# Patient Record
Sex: Female | Born: 1960 | Race: Black or African American | Hispanic: No | Marital: Single | Smoking: Never smoker
Health system: Southern US, Community
[De-identification: ages and names within clinical notes are randomized; demographics above are authoritative.]

## PROBLEM LIST (undated history)

## (undated) DIAGNOSIS — E785 Hyperlipidemia, unspecified: Secondary | ICD-10-CM

## (undated) DIAGNOSIS — E119 Type 2 diabetes mellitus without complications: Secondary | ICD-10-CM

## (undated) DIAGNOSIS — I10 Essential (primary) hypertension: Secondary | ICD-10-CM

## (undated) HISTORY — DX: Type 2 diabetes mellitus without complications: E11.9

## (undated) HISTORY — DX: Essential (primary) hypertension: I10

## (undated) HISTORY — DX: Hyperlipidemia, unspecified: E78.5

## (undated) HISTORY — PX: NO PAST SURGERIES: SHX2092

---

## 2016-07-21 ENCOUNTER — Ambulatory Visit: Payer: Self-pay | Admitting: Nurse Practitioner

## 2016-10-07 ENCOUNTER — Encounter: Payer: Self-pay | Admitting: Nurse Practitioner

## 2016-10-07 ENCOUNTER — Ambulatory Visit (INDEPENDENT_AMBULATORY_CARE_PROVIDER_SITE_OTHER): Payer: PRIVATE HEALTH INSURANCE | Admitting: Nurse Practitioner

## 2016-10-07 VITALS — BP 166/92 | HR 68 | Temp 98.5°F | Resp 14 | Ht 60.0 in | Wt 152.2 lb

## 2016-10-07 DIAGNOSIS — M25512 Pain in left shoulder: Secondary | ICD-10-CM

## 2016-10-07 DIAGNOSIS — F558 Abuse of other non-psychoactive substances: Secondary | ICD-10-CM | POA: Diagnosis not present

## 2016-10-07 DIAGNOSIS — Z7689 Persons encountering health services in other specified circumstances: Secondary | ICD-10-CM

## 2016-10-07 DIAGNOSIS — I10 Essential (primary) hypertension: Secondary | ICD-10-CM | POA: Diagnosis not present

## 2016-10-07 LAB — COMPREHENSIVE METABOLIC PANEL
ALT: 25 U/L (ref 6–29)
AST: 22 U/L (ref 10–35)
Albumin: 4.3 g/dL (ref 3.6–5.1)
Alkaline Phosphatase: 89 U/L (ref 33–130)
BUN: 14 mg/dL (ref 7–25)
CO2: 26 mmol/L (ref 20–31)
Calcium: 9.5 mg/dL (ref 8.6–10.4)
Chloride: 104 mmol/L (ref 98–110)
Creat: 0.63 mg/dL (ref 0.50–1.05)
Glucose, Bld: 95 mg/dL (ref 65–99)
Potassium: 3.7 mmol/L (ref 3.5–5.3)
Sodium: 140 mmol/L (ref 135–146)
Total Bilirubin: 0.2 mg/dL (ref 0.2–1.2)
Total Protein: 7.5 g/dL (ref 6.1–8.1)

## 2016-10-07 MED ORDER — HYDROCHLOROTHIAZIDE 12.5 MG PO TABS: 12.5000 mg | ORAL_TABLET | Freq: Every day | ORAL | 3 refills | Status: DC

## 2016-10-07 NOTE — Progress Notes (Signed)
Subjective:    Patient ID: Courtney Mccarty, female    DOB: Jan 04, 1961, 55 y.o.   MRN: [id number]  Courtney Mccarty is a 56 y.o. female presenting on 10/07/2016 for New Patient (Initial Visit) and Pain (in joints)    HPI  Establish Care New Provider Pt last seen by PCP 4-5 years ago.  Obtain records from Dr. Jeanne Ivan in [place], [place].   Shoulder Pain CNA work as Education officer, museum.  Experienced L shoulder injury.  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  Has recurrent left shoulder pain and new pain of R shoulder, hands. Pain is described as aching and sore.  It is not limiting her work or other physical activities.  Pt is taking acetaminophen Extra Strength 5-6 pills takes 2 x per day. (2500 mg per dose for total of 5,000 mg daily).  From earlier injury, pt had previously received referral for neurology from Dr. Jeanne Ivan in [place], [place].  She has never followed through w/ that workup for tingling in her hands.  Hypertension Elevated on BP check today:  No prior BP elevations or current symptoms.  Current medications: none  Pt denies headache, lightheadedness, dizziness, changes in vision, chest tightness/pressure, palpitations, leg swelling, sudden loss of speech or loss of consciousness.  Pt states she is eating low salt diet, but states she is using Lawry's seasoning salt instead of table salt to reduce her sodium, which does not have less sodium than table salt.  Pt notes history of Gout- no current flare.  History reviewed. No pertinent past medical history. History reviewed. No pertinent surgical history. Social History   Social History  . Marital status: Single    Spouse name: N/A  . Number of children: N/A  . Years of education: N/A   Occupational History  . Not on file.   Social History Main Topics  . Smoking status: Never Smoker  . Smokeless tobacco: Never Used  . Alcohol use 0.6 oz/week    1 Cans of beer per week  . Drug use: No  . Sexual activity:  Yes    Birth control/ protection: None   Other Topics Concern  . Not on file   Social History Narrative  . No narrative on file   Family History  Problem Relation Age of Onset  . Diabetes Mother   . Hypertension Mother   . Heart disease Mother   . Depression Mother   . Diabetes Father   . Hypertension Father   . Diabetes Maternal Grandmother   . Cancer Maternal Grandfather   . Diabetes Paternal Grandmother   . Cancer Paternal Grandfather    No current outpatient prescriptions on file prior to visit.   No current facility-administered medications on file prior to visit.     Review of Systems  Constitutional: Negative.   HENT: Negative.   Eyes: Negative.   Respiratory: Negative.   Cardiovascular: Negative.   Gastrointestinal: Negative.   Endocrine: Negative.   Genitourinary: Negative.   Musculoskeletal: Positive for arthralgias.  Skin: Negative.   Allergic/Immunologic: Negative.   Neurological: Negative.   Hematological: Negative.   Psychiatric/Behavioral: Negative.    Per HPI unless specifically indicated above      Objective:    BP (!) 166/92 (BP Location: Left Arm, Patient Position: Sitting, Cuff Size: Normal)   Pulse 68   Temp 98.5 F (36.9 C) (Oral)   Resp 14   Ht 5' (1.524 m)   Wt 152 lb 3.2 oz (69 kg)  LMP 04/05/2015 (Approximate)   SpO2 98%   BMI 29.72 kg/m    Wt Readings from Last 3 Encounters:  10/07/16 152 lb 3.2 oz (69 kg)    Physical Exam  Constitutional: She is oriented to person, place, and time. She appears well-developed and well-nourished. No distress.  HENT:  Head: Normocephalic and atraumatic.  Right Ear: External ear normal.  Left Ear: External ear normal.  Mouth/Throat: Oropharynx is clear and moist.  Eyes: Conjunctivae and EOM are normal. Pupils are equal, round, and reactive to light.  Neck: Normal range of motion. Neck supple. No JVD present. No tracheal deviation present. No thyromegaly present.  Cardiovascular: Normal  rate, regular rhythm, normal heart sounds and intact distal pulses.   Pulmonary/Chest: Effort normal and breath sounds normal. No respiratory distress.  Abdominal: Soft. Bowel sounds are normal. She exhibits no distension and no mass. There is no tenderness.  Musculoskeletal: She exhibits tenderness.  LeftShoulder Inspection: Normal appearance bilateral symmetrical Palpation: Non-tender to palpation over anterior, or posterior shoulder.  Tender to deep palpation of lateral shoulder anterior to A-P midline  ROM: Full intact active ROM forward flexion, abduction, internal / external rotation, symmetrical Special Testing: Rotator cuff testing negative for weakness with supraspinatus full can and empty can test, but empty can test positive for eliciting pain. Hawkin's AC impingement negative for pain Strength: Normal strength 5/5 flex/ext, ext rot / int rot, grip, rotator cuff str testing. Neurovascular: Distally intact pulses, sensation to light touch   Lymphadenopathy:    She has no cervical adenopathy.  Neurological: She is alert and oriented to person, place, and time.  Skin: Skin is warm and dry.  Psychiatric: She has a normal mood and affect. Her behavior is normal. Judgment and thought content normal.   Results for orders placed or performed in visit on 10/07/16  Comprehensive metabolic panel  Result Value Ref Range   Sodium 140 135 - 146 mmol/L   Potassium 3.7 3.5 - 5.3 mmol/L   Chloride 104 98 - 110 mmol/L   CO2 26 20 - 31 mmol/L   Glucose, Bld 95 65 - 99 mg/dL   BUN 14 7 - 25 mg/dL   Creat 0.63 0.50 - 1.05 mg/dL   Total Bilirubin 0.2 0.2 - 1.2 mg/dL   Alkaline Phosphatase 89 33 - 130 U/L   AST 22 10 - 35 U/L   ALT 25 6 - 29 U/L   Total Protein 7.5 6.1 - 8.1 g/dL   Albumin 4.3 3.6 - 5.1 g/dL   Calcium 9.5 8.6 - 10.4 mg/dL      Assessment & Plan:   Problem List Items Addressed This Visit      Cardiovascular and Mediastinum   Essential hypertension - Primary    Pt w/ new  dx hypertension on exam today.  Single elevated reading at check in of 166/92 w/ repeat manually of 172/92.  Pt w/o s/sx of hypertension.  Plan: 1. Discussed basic disease process and lifestyle modifications of physical activity and heart healthy diet.  Provided DASH info. 2. START HCTZ 12.5 mg once daily.  Pt w/ history of gout, but no flares in "several years."  Proceed w/ HCTZ r/t pt African American and first line agent per guidelines.  Low threshold for changing therapy. 3. Encouraged BP check 1-2 x weekly at home.  Goal less than 130/80.  Call clinic if > 140/90 after 2 weeks or less than 100/70. 4. Follow up w/ BP check w/ annual physical and in 4  weeks for review of HTN.      Relevant Medications   hydrochlorothiazide (HYDRODIURIL) 12.5 MG tablet   Other Relevant Orders   Comprehensive metabolic panel (Completed)     Other   Acetaminophen abuse    Pt using 5,000 mg daily or more.  Pt w/o s/sx of jaundice today.  Plan: 1. Check CMP eval liver function. 2. Reviewed max dosing of acetaminophen to 3,000 mg in 24 hr period. 3. Address shoulder pain w/ Aleeve.  Take two OTC tablets every 12 hours as needed. - Encouraged proper body mechanics. - Encouraged rest, ice/heat. - Consider orthopedics and / or PT for further treatment. 4. Follow up 4 weeks.      Relevant Orders   Comprehensive metabolic panel (Completed)    Other Visit Diagnoses    Encounter to establish care     Pt w/ previous PCP in [place].  Last appointment 4-5 years ago w/ Dr. Jeanne Ivan in [place], [place].    Acute pain of left shoulder     Pt w/ acute injury of left shoulder.  Possible partial tear or strain of supraspinatus muscle not impacting AROM. Pain likely self-limited.  Plan: 1. Rest, apply ice/heat. 2. Treat with NSAIDs (acetaminophen and ibuprofen).  Discussed alternate dosing and max dosing. 3. May also apply a muscle rub with lidocaine after heat or ice. 4. Follow up 4 weeks or sooner if needed.  Consider orthopedics vs. PT if injury persists or worsens.       Meds ordered this encounter  Medications  . acetaminophen (TYLENOL) 500 MG tablet    Sig: Take 500 mg by mouth every 6 (six) hours as needed.  . hydrochlorothiazide (HYDRODIURIL) 12.5 MG tablet    Sig: Take 1 tablet (12.5 mg total) by mouth daily.    Dispense:  90 tablet    Refill:  3    Order Specific Question:   Supervising Provider    Answer:   Olin Hauser [[id number]]      Follow up plan: Return in about 4 weeks (around 11/04/2016) for and in 1-3 weeks for your annual physical.  Cassell Smiles, DNP, AGPCNP-BC Adult Gerontology Primary Care Nurse Practitioner [facility] 10/11/2016, 12:37 PM

## 2016-10-07 NOTE — Patient Instructions (Addendum)
Treacy, Thank you for coming in to clinic today.  1. For your aches and pains: Take Aleeve or naproxen sodium 220 mg pills.   - Take 2 tablets every 12 hours maximum (at the most) - NO MORE than 4 pills in 24 hours. - If you need more pain relief, take acetaminophen (red/white bottle) 2 tablets every 5 hours as needed. NO MORE than 6 pills in 24 hours.   2. For your blood pressure: - START taking hydrochlorothiazide 12.5 mg tablet once daily at the same time each day. - DASH eating plan: start eating more like this eating plan by choosing one item each week to start adding to your diet.  Pick one or two per week and continue these as you add new healthier foods.   3. You will be due for FASTING BLOOD WORK (no food or drink after midnight before, only water or coffee without cream/sugar on the morning of)  - Please go ahead and schedule a "Lab Only" visit in the morning at the clinic for lab draw in 2-3 days before your physical or the same morning of your Annual Physical  For Lab Results, once available within 2-3 days of blood draw, you can can log in to MyChart online to view your results and a brief explanation. Also, we can discuss results at next follow-up visit.  Please schedule a follow-up appointment with Cassell Smiles, AGNP to Return in about 4 weeks (around 11/04/2016) for and in 1-3 weeks for your annual physical.  If you have any other questions or concerns, please feel free to call the clinic or send a message through [facility]. You may also schedule an earlier appointment if necessary.  Cassell Smiles, DNP, AGNP-BC Adult Gerontology Nurse Practitioner [facility]    DASH Eating Plan DASH stands for "Dietary Approaches to Stop Hypertension." The DASH eating plan is a healthy eating plan that has been shown to reduce high blood pressure (hypertension). It may also reduce your risk for type 2 diabetes, heart disease, and stroke. The DASH eating plan may  also help with weight loss. What are tips for following this plan? General guidelines  Avoid eating more than 2,300 mg (milligrams) of salt (sodium) a day. If you have hypertension, you may need to reduce your sodium intake to 1,500 mg a day.  Limit alcohol intake to no more than 1 drink a day for nonpregnant women and 2 drinks a day for men. One drink equals 12 oz of beer, 5 oz of wine, or 1 oz of hard liquor.  Work with your health care provider to maintain a healthy body weight or to lose weight. Ask what an ideal weight is for you.  Get at least 30 minutes of exercise that causes your heart to beat faster (aerobic exercise) most days of the week. Activities may include walking, swimming, or biking.  Work with your health care provider or diet and nutrition specialist (dietitian) to adjust your eating plan to your individual calorie needs. Reading food labels  Check food labels for the amount of sodium per serving. Choose foods with less than 5 percent of the Daily Value of sodium. Generally, foods with less than 300 mg of sodium per serving fit into this eating plan.  -To find whole grains, look for the word "whole" as the first word in the ingredient list. Shopping  Buy products labeled as "low-sodium" or "no salt added."  Buy fresh foods. Avoid canned foods and premade or frozen  meals. Cooking  Avoid adding salt when cooking. Use salt-free seasonings or herbs instead of table salt or sea salt. Check with your health care provider or pharmacist before using salt substitutes.  Do not fry foods. Cook foods using healthy methods such as baking, boiling, grilling, and broiling instead.  Cook with heart-healthy oils, such as olive, canola, soybean, or sunflower oil. Meal planning -  Eat a balanced diet that includes: ? 5 or more servings of fruits and vegetables each day. At each meal, try to fill half of your plate with fruits and vegetables. ? Up to 6-8 servings of whole grains  each day. ? Less than 6 oz of lean meat, poultry, or fish each day. A 3-oz serving of meat is about the same size as a deck of cards. One egg equals 1 oz. ? 2 servings of low-fat dairy each day. ? A serving of nuts, seeds, or beans 5 times each week. ? Heart-healthy fats. Healthy fats called Omega-3 fatty acids are found in foods such as flaxseeds and coldwater fish, like sardines, salmon, and mackerel.  Limit how much you eat of the following: ? Canned or prepackaged foods. ? Food that is high in trans fat, such as fried foods. ? Food that is high in saturated fat, such as fatty meat. ? Sweets, desserts, sugary drinks, and other foods with added sugar. ? Full-fat dairy products.  Do not salt foods before eating.  Try to eat at least 2 vegetarian meals each week.  Eat more home-cooked food and less restaurant, buffet, and fast food.  When eating at a restaurant, ask that your food be prepared with less salt or no salt, if possible. What foods are recommended? The items listed may not be a complete list. Talk with your dietitian about what dietary choices are best for you. Grains Whole-grain or whole-wheat bread. Whole-grain or whole-wheat pasta. Brown rice. [place]ena Morrow. Bulgur. Whole-grain and low-sodium cereals. Pita bread. Low-fat, low-sodium crackers. Whole-wheat flour tortillas. Vegetables Fresh or frozen vegetables (raw, steamed, roasted, or grilled). Low-sodium or reduced-sodium tomato and vegetable juice. Low-sodium or reduced-sodium tomato sauce and tomato paste. Low-sodium or reduced-sodium canned vegetables. Fruits All fresh, dried, or frozen fruit. Canned fruit in natural juice (without added sugar). Meat and other protein foods Skinless chicken or [place]. Ground chicken or [place]. Pork with fat trimmed off. Fish and seafood. Egg whites. Dried beans, peas, or lentils. Unsalted nuts, nut butters, and seeds. Unsalted canned beans. Lean cuts of beef with fat trimmed off.  Low-sodium, lean deli meat. Dairy Low-fat (1%) or fat-free (skim) milk. Fat-free, low-fat, or reduced-fat cheeses. Nonfat, low-sodium ricotta or cottage cheese. Low-fat or nonfat yogurt. Low-fat, low-sodium cheese. Fats and oils Soft margarine without trans fats. Vegetable oil. Low-fat, reduced-fat, or light mayonnaise and salad dressings (reduced-sodium). Canola, safflower, olive, soybean, and sunflower oils. Avocado. Seasoning and other foods Herbs. Spices. Seasoning mixes without salt. Unsalted popcorn and pretzels. Fat-free sweets. What foods are not recommended? The items listed may not be a complete list. Talk with your dietitian about what dietary choices are best for you. Grains Baked goods made with fat, such as croissants, muffins, or some breads. Dry pasta or rice meal packs. Vegetables Creamed or fried vegetables. Vegetables in a cheese sauce. Regular canned vegetables (not low-sodium or reduced-sodium). Regular canned tomato sauce and paste (not low-sodium or reduced-sodium). Regular tomato and vegetable juice (not low-sodium or reduced-sodium). Angie Fava. Olives. Fruits Canned fruit in a light or heavy syrup. Fried fruit. Fruit in cream  or butter sauce. Meat and other protein foods Fatty cuts of meat. Ribs. Fried meat. Berniece Salines. Sausage. Bologna and other processed lunch meats. Salami. Fatback. Hotdogs. Bratwurst. Salted nuts and seeds. Canned beans with added salt. Canned or smoked fish. Whole eggs or egg yolks. Chicken or [place] with skin. Dairy Whole or 2% milk, cream, and half-and-half. Whole or full-fat cream cheese. Whole-fat or sweetened yogurt. Full-fat cheese. Nondairy creamers. Whipped toppings. Processed cheese and cheese spreads. Fats and oils Butter. Stick margarine. Lard. Shortening. Ghee. Bacon fat. Tropical oils, such as coconut, palm kernel, or palm oil. Seasoning and other foods Salted popcorn and pretzels. Onion salt, garlic salt, seasoned salt, table salt, and sea  salt. Worcestershire sauce. Tartar sauce. Barbecue sauce. Teriyaki sauce. Soy sauce, including reduced-sodium. Steak sauce. Canned and packaged gravies. Fish sauce. Oyster sauce. Cocktail sauce. Horseradish that you find on the shelf. Ketchup. Mustard. Meat flavorings and tenderizers. Bouillon cubes. Hot sauce and Tabasco sauce. Premade or packaged marinades. Premade or packaged taco seasonings. Relishes. Regular salad dressings. Where to find more information:  National Heart, Lung, and [place]: https://wilson-eaton.com/  American Heart Association: www.heart.org Summary  The DASH eating plan is a healthy eating plan that has been shown to reduce high blood pressure (hypertension). It may also reduce your risk for type 2 diabetes, heart disease, and stroke.  With the DASH eating plan, you should limit salt (sodium) intake to 2,300 mg a day. If you have hypertension, you may need to reduce your sodium intake to 1,500 mg a day.  When on the DASH eating plan, aim to eat more fresh fruits and vegetables, whole grains, lean proteins, low-fat dairy, and heart-healthy fats.  Work with your health care provider or diet and nutrition specialist (dietitian) to adjust your eating plan to your individual calorie needs. This information is not intended to replace advice given to you by your health care provider. Make sure you discuss any questions you have with your health care provider. Document Released: 03/10/2011 Document Revised: 03/14/2016 Document Reviewed: 03/14/2016 Elsevier Interactive Patient Education  2017 [employer].

## 2016-10-11 DIAGNOSIS — I10 Essential (primary) hypertension: Secondary | ICD-10-CM

## 2016-10-11 DIAGNOSIS — F558 Abuse of other non-psychoactive substances: Secondary | ICD-10-CM | POA: Insufficient documentation

## 2016-10-11 HISTORY — DX: Essential (primary) hypertension: I10

## 2016-10-11 NOTE — Progress Notes (Signed)
I have reviewed this encounter including the documentation in this note and/or discussed this patient with the provider, Cassell Smiles, AGPCNP-BC. I am certifying that I agree with the content of this note as supervising physician.  Nobie Putnam, [facility] 10/11/2016, 3:05 PM

## 2016-10-11 NOTE — Assessment & Plan Note (Addendum)
Pt using 5,000 mg daily or more.  Pt w/o s/sx of jaundice today.  Plan: 1. Check CMP eval liver function. 2. Reviewed max dosing of acetaminophen to 3,000 mg in 24 hr period. 3. Address shoulder pain w/ Aleeve.  Take two OTC tablets every 12 hours as needed. - Encouraged proper body mechanics. - Encouraged rest, ice/heat. - Consider orthopedics and / or PT for further treatment. 4. Follow up 4 weeks.

## 2016-10-11 NOTE — Assessment & Plan Note (Signed)
Pt w/ new dx hypertension on exam today.  Single elevated reading at check in of 166/92 w/ repeat manually of 172/92.  Pt w/o s/sx of hypertension.  Plan: 1. Discussed basic disease process and lifestyle modifications of physical activity and heart healthy diet.  Provided DASH info. 2. START HCTZ 12.5 mg once daily.  Pt w/ history of gout, but no flares in "several years."  Proceed w/ HCTZ r/t pt African American and first line agent per guidelines.  Low threshold for changing therapy. 3. Encouraged BP check 1-2 x weekly at home.  Goal less than 130/80.  Call clinic if > 140/90 after 2 weeks or less than 100/70. 4. Follow up w/ BP check w/ annual physical and in 4 weeks for review of HTN.

## 2016-10-12 ENCOUNTER — Encounter: Payer: Self-pay | Admitting: Nurse Practitioner

## 2016-10-17 ENCOUNTER — Telehealth: Payer: Self-pay

## 2016-10-17 ENCOUNTER — Other Ambulatory Visit: Payer: Self-pay | Admitting: Nurse Practitioner

## 2016-10-17 ENCOUNTER — Other Ambulatory Visit: Payer: Self-pay

## 2016-10-17 DIAGNOSIS — Z0000 Encounter for general adult medical examination without abnormal findings: Secondary | ICD-10-CM

## 2016-10-17 NOTE — Telephone Encounter (Signed)
I attempted to contact the pt to inform her that her labs have to be done within 7 days of her physical. No answer, left message on vm to return my call.

## 2016-10-26 ENCOUNTER — Other Ambulatory Visit: Payer: Self-pay

## 2016-11-02 ENCOUNTER — Encounter: Payer: Self-pay | Admitting: Nurse Practitioner

## 2016-11-08 ENCOUNTER — Other Ambulatory Visit: Payer: Self-pay | Admitting: Nurse Practitioner

## 2016-11-08 ENCOUNTER — Encounter: Payer: Self-pay | Admitting: Nurse Practitioner

## 2016-11-08 ENCOUNTER — Ambulatory Visit (INDEPENDENT_AMBULATORY_CARE_PROVIDER_SITE_OTHER): Payer: PRIVATE HEALTH INSURANCE | Admitting: Nurse Practitioner

## 2016-11-08 VITALS — BP 113/67 | HR 64 | Temp 97.9°F | Ht 60.0 in | Wt 148.4 lb

## 2016-11-08 DIAGNOSIS — N950 Postmenopausal bleeding: Secondary | ICD-10-CM

## 2016-11-08 DIAGNOSIS — Z1211 Encounter for screening for malignant neoplasm of colon: Secondary | ICD-10-CM

## 2016-11-08 DIAGNOSIS — Z1231 Encounter for screening mammogram for malignant neoplasm of breast: Secondary | ICD-10-CM

## 2016-11-08 DIAGNOSIS — Z13820 Encounter for screening for osteoporosis: Secondary | ICD-10-CM | POA: Diagnosis not present

## 2016-11-08 DIAGNOSIS — Z124 Encounter for screening for malignant neoplasm of cervix: Secondary | ICD-10-CM | POA: Diagnosis not present

## 2016-11-08 DIAGNOSIS — Z0000 Encounter for general adult medical examination without abnormal findings: Secondary | ICD-10-CM

## 2016-11-08 DIAGNOSIS — Z1159 Encounter for screening for other viral diseases: Secondary | ICD-10-CM

## 2016-11-08 DIAGNOSIS — Z1239 Encounter for other screening for malignant neoplasm of breast: Secondary | ICD-10-CM

## 2016-11-08 DIAGNOSIS — Z23 Encounter for immunization: Secondary | ICD-10-CM | POA: Diagnosis not present

## 2016-11-08 LAB — CBC WITH DIFFERENTIAL/PLATELET
Basophils Absolute: 0 cells/uL (ref 0–200)
Basophils Relative: 0 %
Eosinophils Absolute: 240 cells/uL (ref 15–500)
Eosinophils Relative: 4 %
HCT: 40.4 % (ref 35.0–45.0)
Hemoglobin: 13.1 g/dL (ref 11.7–15.5)
Lymphocytes Relative: 33 %
Lymphs Abs: 1980 cells/uL (ref 850–3900)
MCH: 29.4 pg (ref 27.0–33.0)
MCHC: 32.4 g/dL (ref 32.0–36.0)
MCV: 90.8 fL (ref 80.0–100.0)
MPV: 9.8 fL (ref 7.5–12.5)
Monocytes Absolute: 360 cells/uL (ref 200–950)
Monocytes Relative: 6 %
Neutro Abs: 3420 cells/uL (ref 1500–7800)
Neutrophils Relative %: 57 %
Platelets: 246 10*3/uL (ref 140–400)
RBC: 4.45 MIL/uL (ref 3.80–5.10)
RDW: 13.3 % (ref 11.0–15.0)
WBC: 6 10*3/uL (ref 3.8–10.8)

## 2016-11-08 LAB — LIPID PANEL
Cholesterol: 150 mg/dL (ref <200)
HDL: 37 mg/dL — ABNORMAL LOW (ref >50)
LDL Cholesterol: 55 mg/dL (ref <100)
Total CHOL/HDL Ratio: 4.1 Ratio (ref <5.0)
Triglycerides: 290 mg/dL — ABNORMAL HIGH (ref <150)
VLDL: 58 mg/dL — ABNORMAL HIGH (ref <30)

## 2016-11-08 LAB — TSH: TSH: 1.84 mIU/L

## 2016-11-08 NOTE — Progress Notes (Signed)
Subjective:    Patient ID: Courtney Mccarty, female    DOB: Jan 31, 1961, 56 y.o.   MRN: [id number]  Courtney Mccarty is a 56 y.o. female presenting on 11/08/2016 for Annual Exam  No prior records received.  Pt previously stated her PCP was Dr. Jeanne Ivan in [place], [place].  Now states it was Barnetta Hammersmith, MD [place] [address] [place], [place].  Attempt to obtain records.  Pt has very low level health literacy.   HPI Left Thumb Soreness Acute concerns today regarding left thumb soreness started 2 months ago.  Thinks it is related to work where she lifts 40-50 lbs of boxes.  Does not remember specific injury.  Uses right hand for manipulating smartphone.  Underarm cysts Pt notes she has periods of time w/ cysts under her arms.  She stopped using her brand of deodorant andd they went away.  She also notes she works in a very labor-intensive job and sweats a lot.    She currently does not have any cysts.  Annual Physical Exam Patient has been feeling well.   Sleeps 5 hours per night interrupted.  She works 3rd shift 6a-630pm 4 days and part-time [employer] (8-9 hours).  HEALTH MAINTENANCE: Weight/BMI: loss of 4 lbs since last visit Physical activity: WOrks a very physically active job Diet: Has cut back on sodas, replacing with water, eats 1-2 meals per day and 3-4 x per week [employer], [employer] season salt. Seatbelt: always Sunscreen: never PAP: last one about 5 years ago.  Pt states 81 mg aspirin triggers her to have vaginal bleeding occurred about 1 year ago.  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 Mammogram: about 5 years ago DEXA: never or 5 years ago Colonoscopy: Done at [facility] bout 5 years ago - "a couple polyps, but everything was fine."   VACCINES: Tetanus: Last tetanus "close to 10 years ago" - due today Shingles: declines   Past Medical History:    Diagnosis Date  . Essential hypertension 10/11/2016   No past surgical history on file. Social History   Social History  . Marital status: Single    Spouse name: N/A  . Number of children: N/A  . Years of education: N/A   Occupational History  . Not on file.   Social History Main Topics  . Smoking status: Never Smoker  . Smokeless tobacco: Never Used  . Alcohol use 0.6 oz/week    1 Cans of beer per week  . Drug use: No  . Sexual activity: Yes    Birth control/ protection: None   Other Topics Concern  . Not on file   Social History Narrative  . No narrative on file   Family History  Problem Relation Age of Onset  . Diabetes Mother   . Hypertension Mother   . Heart disease Mother   . Depression Mother   . Diabetes Father   . Hypertension Father   . Diabetes Maternal Grandmother   . Cancer Maternal Grandfather   . Diabetes Paternal Grandmother   . Cancer Paternal Grandfather    Current Outpatient Prescriptions on File Prior to Visit  Medication Sig  . acetaminophen (TYLENOL) 500 MG tablet Take 500 mg by mouth every 6 (six) hours as needed.  . hydrochlorothiazide (HYDRODIURIL) 12.5 MG tablet Take 1 tablet (12.5 mg total) by mouth daily.   No current facility-administered medications on file prior  to visit.     Review of Systems Per HPI unless specifically indicated above     Objective:    BP 113/67 (BP Location: Right Arm, Patient Position: Sitting, Cuff Size: Normal)   Pulse 64   Temp 97.9 F (36.6 C) (Oral)   Ht 5' (1.524 m)   Wt 148 lb 6.4 oz (67.3 kg)   LMP 04/05/2015 (Approximate)   BMI 28.98 kg/m    Wt Readings from Last 3 Encounters:  11/08/16 148 lb 6.4 oz (67.3 kg)  10/07/16 152 lb 3.2 oz (69 kg)    Physical Exam General - overweight, well-appearing, NAD HEENT - Normocephalic, atraumatic, PERRL, EOMI, patent nares w/o congestion, oropharynx clear, MMM Neck - supple, non-tender, no LAD, no thyromegaly, no carotid bruit Heart - RRR, no  murmurs heard Lungs - Clear throughout all lobes, no wheezing, crackles, or rhonchi. Normal work of breathing. Abdomen - soft, NTND, no masses, no hepatosplenomegaly, active bowel sounds Breast - Normal exam w/ symmetric breasts, no mass, no nipple discharge, no skin changes or tenderness. Fibrocystic changes noted at lower breast border bilaterally. GU - Normal external female genitalia without lesions or fusion. Vaginal canal without lesions or significant atrophy. Normal appearing cervix without lesions or friability. Physiologic discharge on exam. Bimanual exam without adnexal masses, enlarged uterus, or cervical motion tenderness. Extremeties - non-tender, no edema, cap refill < 2 seconds, peripheral pulses intact +2 bilaterally Musculoskeletal - Left Hand/Wrist Inspection: Normal appearance, symmetrical, no bulky MCP joints, no edema or erythema. Palpation: Tender thumb at DIP joint.  Nontender hand / wrist, carpal bones, including MCP, base of thumb. No distinct anatomical snuff box or scaphoid tenderness. ROM: full active wrist ROM flex / ext, ulnar / radial deviation,  Special Testing: pincer grasp decreased Strength: 5/5 grip, thumb opposition, wrist flex/ext Neurovascular: distally intact  Skin - warm, dry, no rashes Neuro - awake, alert, oriented x3, CN II-X intact, intact muscle strength 5/5 bilaterally, intact distal sensation to light touch, normal coordination, normal gait Psych - Normal mood and affect, normal behavior    Results for orders placed or performed in visit on 10/07/16  Comprehensive metabolic panel  Result Value Ref Range   Sodium 140 135 - 146 mmol/L   Potassium 3.7 3.5 - 5.3 mmol/L   Chloride 104 98 - 110 mmol/L   CO2 26 20 - 31 mmol/L   Glucose, Bld 95 65 - 99 mg/dL   BUN 14 7 - 25 mg/dL   Creat 0.63 0.50 - 1.05 mg/dL   Total Bilirubin 0.2 0.2 - 1.2 mg/dL   Alkaline Phosphatase 89 33 - 130 U/L   AST 22 10 - 35 U/L   ALT 25 6 - 29 U/L   Total Protein  7.5 6.1 - 8.1 g/dL   Albumin 4.3 3.6 - 5.1 g/dL   Calcium 9.5 8.6 - 10.4 mg/dL      Assessment & Plan:   Problem List Items Addressed This Visit    None    Visit Diagnoses    Encounter for annual physical exam    -  Primary Physical exam with no new findings.  Well adult with no acute concerns.  Plan: 1. Obtain health maintenance screenings. 2. Return 1 year for annual physical.   Relevant Orders   Vitamin D (25 hydroxy)   CBC with Differential/Platelet   TSH   Hemoglobin A1c   MS DIGITAL SCREENING BILATERAL   DG Bone Density   US Pelvis Complete   Ambulatory referral to  Gastroenterology   US Transvaginal Non-OB   Lipid panel   HIV antibody (with reflex)   Hepatitis C Antibody   Left thumb pain Pain likely self-limited.  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  Plan:  1. Treat with NSAIDs (acetaminophen and aleeve).  Discussed alternate dosing and max dosing. 2. Apply heat and/or ice to affected area. 3. May also apply a muscle rub with lidocaine after heat or ice. 4. Consider orthopedic eval - patient declined today.  Osteoporosis screening     Pt w/ no osteoporosis screening in past. Now postmenopausal.  Due for Dexa Scan.  Plan: 1. Obtain DG bone density.     Relevant Orders   DG Bone Density   Colon cancer screening     Pt requiring colon cancer screening for .  No family history of colon cancer.  Plan: - Discussed timing for initiation of colon cancer screening ACS vs USPSTF guidelines - Mutual decision making discussion for options of colonoscopy vs cologuard.  Pt prefers colonoscopy. - Referral to GI placed.    Relevant Orders   Ambulatory referral to Gastroenterology   Encounter for Papanicolaou smear for cervical cancer screening     Pt without pap smear in about 5 years.  No recall of last result.  Plan: 1. PAP performed today w/ HPV testing.   Relevant Orders   PAP, Thin Prep w/HPV rflx HPV Type 16/18    Breast cancer screening     Pt last mammogram unknown, but likely > 5 years ago.  Plan: 1. Screening mammogram order placed.  Pt will call to schedule appointment.  Information given.   Relevant Orders   MS DIGITAL SCREENING BILATERAL   Postmenopausal vaginal bleeding     Pt w/ report of postmenopausal vaginal bleeding about 1 year ago w/ aspirin 81 mg daily.  Has stopped taking aspirin to avoid bleeding. States has had previous serial vaginal ultrasounds for monitoring of prior condition.  Diagnosis unknown, pt poor historian.  Possibly related to other cause, however likely endometriosis or other uterine pathology.  Plan: 1. Transvaginal and pelvic US r/o endometriosis.   Relevant Orders   US Transvaginal Non-OB   Need for diphtheria-tetanus-pertussis (Tdap) vaccine     Pt needs tetanus vaccine.  > 10 years since last vaccination.  Plan: 1. Reviewed tetanus disease and need for vaccination. 2. Administer vaccine today.   Relevant Orders   Tdap vaccine greater than or equal to 7yo IM (Completed)   Encounter for hepatitis C screening test for low risk patient     Low risk by behavioral assessment.  Pt in age range w/ increased risk.  Plan: 1. Screen Hep C   Relevant Orders   HIV antibody (with reflex)   Hepatitis C Antibody      Meds ordered this encounter  Medications  . naproxen sodium (ANAPROX) 220 MG tablet    Sig: Take 220 mg by mouth 2 (two) times daily with a meal.     Follow up plan: Return in about 1 year (around 11/08/2017) for annual physical.  Cassell Smiles, DNP, AGPCNP-BC Adult Gerontology Primary Care Nurse Practitioner [facility] 11/08/2016, 1:32 PM

## 2016-11-08 NOTE — Progress Notes (Signed)
I have reviewed this encounter including the documentation in this note and/or discussed this patient with the provider, Cassell Smiles, AGPCNP-BC. I am certifying that I agree with the content of this note as supervising physician.  Nobie Putnam, [facility] 11/08/2016, 5:12 PM

## 2016-11-08 NOTE — Patient Instructions (Addendum)
Courtney Mccarty, Thank you for coming in to clinic today.  1. Your physical exam has no new findings.  2. Your mammogram and bone density scan orders have been placed.  Call the Scheduling phone number at [phone] to schedule your mammogram and bone density scan at your convenience.  You can choose to go to either location listed below.  Let the scheduler know which location you prefer.  [facility] Outpatient Radiology [address] [place], [place] [address] [place], [place] [address]   3. You will get a call to schedule your vaginal ultrasound for follow up of your uterine problem that was being followed in [place].    4. For your colon cancer screening, you will get a call from [facility] to schedule this consultation appointment.  5. You have received your tetanus vaccine today.  This will likely make your arm sore. You could have local redness and pain that will resolve in 1-2 days.  6. Fasting labs today in clinic.  7. For your thumb - Start taking Tylenol extra strength 1 to 2 tablets every 8 hours for aches or fever/chills for next few days as needed.  Do not take more than 3,000 mg in 24 hours from all medicines.  May take Aleeve 2 tablets twice daily as needed. - Use heat and ice.  Apply this for 15 minutes at a time 6-8 times per day.   - Muscle rub with lidocaine.  Avoid using this with heat and ice to avoid burns.  Please schedule a follow-up appointment with Courtney Mccarty, AGNP. Return in about 1 year (around 11/08/2017) for annual physical.  If you have any other questions or concerns, please feel free to call the clinic or send a message through Courtney Mccarty. You may also schedule an earlier appointment if necessary.  You will receive a survey after today's visit either digitally by e-mail or paper by [employer]. Your experiences and feedback matter to [place].  Please respond so we know  how we are doing as we provide care for you.   Courtney Smiles, DNP, AGNP-BC Adult Gerontology Nurse Practitioner [facility]   Bone Densitometry Bone densitometry is an imaging test that uses a special X-ray to measure the amount of calcium and other minerals in your bones (bone density). This test is also known as a bone mineral density test or dual-energy X-ray absorptiometry (DXA). The test can measure bone density at your hip and your spine. It is similar to having a regular X-ray. You may have this test to:  Diagnose a condition that causes weak or thin bones (osteoporosis).  Predict your risk of a broken bone (fracture).  Determine how well osteoporosis treatment is working.  Tell a health care provider about:  Any allergies you have.  All medicines you are taking, including vitamins, herbs, eye drops, creams, and over-the-counter medicines.  Any problems you or family members have had with anesthetic medicines.  Any blood disorders you have.  Any surgeries you have had.  Any medical conditions you have.  Possibility of pregnancy.  Any other medical test you had within the previous 14 days that used contrast material. What are the risks? Generally, this is a safe procedure. However, problems can occur and may include the following:  This test exposes you to a very small amount of radiation.  The risks of radiation exposure may be greater to unborn children.  What  happens before the procedure?  Do not take any calcium supplements for 24 hours before having the test. You can otherwise eat and drink what you usually do.  Take off all metal jewelry, eyeglasses, dental appliances, and any other metal objects. What happens during the procedure?  You may lie on an exam table. There will be an X-ray generator below you and an imaging device above you.  Other devices, such as boxes or braces, may be used to position your body properly for the  scan.  You will need to lie still while the machine slowly scans your body.  The images will show up on a computer monitor. What happens after the procedure? You may need more testing at a later time. This information is not intended to replace advice given to you by your health care provider. Make sure you discuss any questions you have with your health care provider. Document Released: 04/12/2004 Document Revised: 08/27/2015 Document Reviewed: 08/29/2013 Elsevier Interactive Patient Education  2018 [employer].

## 2016-11-09 LAB — HEMOGLOBIN A1C
Hgb A1c MFr Bld: 5.8 % — ABNORMAL HIGH (ref <5.7)
Mean Plasma Glucose: 120 mg/dL

## 2016-11-09 LAB — HIV ANTIBODY (ROUTINE TESTING W REFLEX): HIV 1&2 Ab, 4th Generation: NONREACTIVE

## 2016-11-09 LAB — VITAMIN D 25 HYDROXY (VIT D DEFICIENCY, FRACTURES): Vit D, 25-Hydroxy: 30 ng/mL (ref 30–100)

## 2016-11-11 LAB — PAP, THIN PREP W/HPV RFLX HPV TYPE 16/18: HPV DNA High Risk: NOT DETECTED

## 2016-11-14 ENCOUNTER — Ambulatory Visit: Payer: PRIVATE HEALTH INSURANCE

## 2016-11-21 ENCOUNTER — Telehealth: Payer: Self-pay | Admitting: Gastroenterology

## 2016-11-21 NOTE — Telephone Encounter (Signed)
Patient is returning a call to schedule a colonoscopy °

## 2016-11-22 ENCOUNTER — Other Ambulatory Visit: Payer: Self-pay

## 2016-11-22 ENCOUNTER — Telehealth: Payer: Self-pay

## 2016-11-22 DIAGNOSIS — Z1212 Encounter for screening for malignant neoplasm of rectum: Principal | ICD-10-CM

## 2016-11-22 DIAGNOSIS — Z1211 Encounter for screening for malignant neoplasm of colon: Secondary | ICD-10-CM

## 2016-11-22 NOTE — Telephone Encounter (Signed)
Spoke to patient. She's calling back after finishing her laundry.

## 2016-11-22 NOTE — Telephone Encounter (Signed)
Gastroenterology Pre-Procedure Review  Request Date: 8/27 Requesting Physician: Dr. Allen Norris  PATIENT REVIEW QUESTIONS: The patient responded to the following health history questions as indicated:    *No major illnesses at this time. Last colonoscopy: 2012 @ [facility]  1. Are you having any GI issues? no 2. Do you have a personal history of Polyps? no 3. Do you have a family history of Colon Cancer or Polyps? no 4. Diabetes Mellitus? no 5. Joint replacements in the past 12 months?no 6. Major health problems in the past 3 months?no 7. Any artificial heart valves, MVP, or defibrillator?no    MEDICATIONS & ALLERGIES:    Patient reports the following regarding taking any anticoagulation/antiplatelet therapy:   Plavix, Coumadin, Eliquis, Xarelto, Lovenox, Pradaxa, Brilinta, or Effient? no Aspirin? no  Patient confirms/reports the following medications:  Current Outpatient Prescriptions  Medication Sig Dispense Refill  . acetaminophen (TYLENOL) 500 MG tablet Take 500 mg by mouth every 6 (six) hours as needed.    . hydrochlorothiazide (HYDRODIURIL) 12.5 MG tablet Take 1 tablet (12.5 mg total) by mouth daily. 90 tablet 3  . naproxen sodium (ANAPROX) 220 MG tablet Take 220 mg by mouth 2 (two) times daily with a meal.     No current facility-administered medications for this visit.     Patient confirms/reports the following allergies:  No Known Allergies  No orders of the defined types were placed in this encounter.   AUTHORIZATION INFORMATION Primary Insurance: 1D#: Group #:  Secondary Insurance: 1D#: Group #:  SCHEDULE INFORMATION: Date: 8/27 Time: Location: [place]

## 2016-11-23 ENCOUNTER — Encounter: Payer: Self-pay | Admitting: *Deleted

## 2016-11-25 NOTE — Discharge Instructions (Signed)
General Anesthesia, Adult, Care After °These instructions provide you with information about caring for yourself after your procedure. Your health care provider may also give you more specific instructions. Your treatment has been planned according to current medical practices, but problems sometimes occur. Call your health care provider if you have any problems or questions after your procedure. °What can I expect after the procedure? °After the procedure, it is common to have: °· Vomiting. °· A sore throat. °· Mental slowness. ° °It is common to feel: °· Nauseous. °· Cold or shivery. °· Sleepy. °· Tired. °· Sore or achy, even in parts of your body where you did not have surgery. ° °Follow these instructions at home: °For at least 24 hours after the procedure: °· Do not: °? Participate in activities where you could fall or become injured. °? Drive. °? Use heavy machinery. °? Drink alcohol. °? Take sleeping pills or medicines that cause drowsiness. °? Make important decisions or sign legal documents. °? Take care of children on your own. °· Rest. °Eating and drinking °· If you vomit, drink water, juice, or soup when you can drink without vomiting. °· Drink enough fluid to keep your urine clear or pale yellow. °· Make sure you have little or no nausea before eating solid foods. °· Follow the diet recommended by your health care provider. °General instructions °· Have a responsible adult stay with you until you are awake and alert. °· Return to your normal activities as told by your health care provider. Ask your health care provider what activities are safe for you. °· Take over-the-counter and prescription medicines only as told by your health care provider. °· If you smoke, do not smoke without supervision. °· Keep all follow-up visits as told by your health care provider. This is important. °Contact a health care provider if: °· You continue to have nausea or vomiting at home, and medicines are not helpful. °· You  cannot drink fluids or start eating again. °· You cannot urinate after 8-12 hours. °· You develop a skin rash. °· You have fever. °· You have increasing redness at the site of your procedure. °Get help right away if: °· You have difficulty breathing. °· You have chest pain. °· You have unexpected bleeding. °· You feel that you are having a life-threatening or urgent problem. °This information is not intended to replace advice given to you by your health care provider. Make sure you discuss any questions you have with your health care provider. °Document Released: 06/27/2000 Document Revised: 08/24/2015 Document Reviewed: 03/05/2015 °Elsevier Interactive Patient Education © 2018 Elsevier Inc. ° °

## 2016-11-28 ENCOUNTER — Ambulatory Visit: Payer: PRIVATE HEALTH INSURANCE | Admitting: Anesthesiology

## 2016-11-28 ENCOUNTER — Encounter
Admission: RE | Disposition: A | Discharge status: Home / Self Care | Payer: Self-pay | Source: Ambulatory Visit | Attending: Gastroenterology

## 2016-11-28 ENCOUNTER — Ambulatory Visit
Admission: RE | Admit: 2016-11-28 | Discharge: 2016-11-28 | Disposition: A | Discharge status: Home / Self Care | Payer: PRIVATE HEALTH INSURANCE | Source: Ambulatory Visit | Attending: Gastroenterology | Admitting: Gastroenterology

## 2016-11-28 DIAGNOSIS — K635 Polyp of colon: Secondary | ICD-10-CM

## 2016-11-28 DIAGNOSIS — D125 Benign neoplasm of sigmoid colon: Secondary | ICD-10-CM

## 2016-11-28 DIAGNOSIS — K6389 Other specified diseases of intestine: Secondary | ICD-10-CM | POA: Insufficient documentation

## 2016-11-28 DIAGNOSIS — I10 Essential (primary) hypertension: Secondary | ICD-10-CM | POA: Diagnosis not present

## 2016-11-28 DIAGNOSIS — Z860100 Personal history of colon polyps, unspecified: Secondary | ICD-10-CM

## 2016-11-28 DIAGNOSIS — D120 Benign neoplasm of cecum: Secondary | ICD-10-CM | POA: Diagnosis not present

## 2016-11-28 DIAGNOSIS — Z1211 Encounter for screening for malignant neoplasm of colon: Secondary | ICD-10-CM | POA: Diagnosis not present

## 2016-11-28 DIAGNOSIS — K640 First degree hemorrhoids: Secondary | ICD-10-CM | POA: Diagnosis not present

## 2016-11-28 HISTORY — PX: COLONOSCOPY WITH PROPOFOL: SHX5780

## 2016-11-28 HISTORY — PX: POLYPECTOMY: SHX5525

## 2016-11-28 SURGERY — COLONOSCOPY WITH PROPOFOL
Anesthesia: General | Site: Rectum | Wound class: Dirty or Infected

## 2016-11-28 MED ORDER — STERILE WATER FOR IRRIGATION IR SOLN
Status: DC | PRN
  Administered 2016-11-28: 12:00:00

## 2016-11-28 MED ORDER — PROPOFOL 10 MG/ML IV BOLUS
INTRAVENOUS | Status: DC | PRN
  Administered 2016-11-28: 50 mg via INTRAVENOUS
  Administered 2016-11-28: 100 mg via INTRAVENOUS
  Administered 2016-11-28 (×3): 50 mg via INTRAVENOUS

## 2016-11-28 MED ORDER — LACTATED RINGERS IV SOLN
10.0000 mL/h | INTRAVENOUS | Status: DC
  Administered 2016-11-28: 10 mL/h via INTRAVENOUS

## 2016-11-28 MED ORDER — LIDOCAINE HCL (CARDIAC) 20 MG/ML IV SOLN
INTRAVENOUS | Status: DC | PRN
  Administered 2016-11-28: 50 mg via INTRAVENOUS

## 2016-11-28 SURGICAL SUPPLY — 23 items

## 2016-11-28 NOTE — Op Note (Signed)
[facility] Gastroenterology Patient Name: Courtney Mccarty Procedure Date: 11/28/2016 11:23 AM MRN: [id number] Account #: [id number] Date of Birth: [birth year] Admit Type: Outpatient Age: 56 Room: [facility] OR ROOM 01 Gender: Female Note Status: Finalized Procedure:            Colonoscopy Indications:          High risk colon cancer surveillance: Personal history                        of colonic polyps Providers:            Lucilla Lame MD, MD Referring MD:         Olin Hauser (Referring MD) Medicines:            Propofol per Anesthesia Complications:        No immediate complications. Procedure:            Pre-Anesthesia Assessment:                       - Prior to the procedure, a History and Physical was                        performed, and patient medications and allergies were                        reviewed. The patient's tolerance of previous                        anesthesia was also reviewed. The risks and benefits of                        the procedure and the sedation options and risks were                        discussed with the patient. All questions were                        answered, and informed consent was obtained. Prior                        Anticoagulants: The patient has taken no previous                        anticoagulant or antiplatelet agents. ASA Grade                        Assessment: II - A patient with mild systemic disease.                        After reviewing the risks and benefits, the patient was                        deemed in satisfactory condition to undergo the                        procedure.                       After obtaining informed consent, the colonoscope was  passed under direct vision. Throughout the procedure,                        the patient's blood pressure, pulse, and oxygen                        saturations were monitored continuously. The Olympus    CF-HQ190L Colonoscope (S#. [id number]) was introduced                        through the anus and advanced to the the cecum,                        identified by appendiceal orifice and ileocecal valve.                        The colonoscopy was performed without difficulty. The                        patient tolerated the procedure well. The quality of                        the bowel preparation was excellent. Findings:      The perianal and digital rectal examinations were normal.      A 3 mm polyp was found in the cecum. The polyp was sessile. The polyp       was removed with a cold biopsy forceps. Resection and retrieval were       complete.      A 4 mm polyp was found in the sigmoid colon. The polyp was sessile. The       polyp was removed with a cold biopsy forceps. Resection and retrieval       were complete.      Non-bleeding internal hemorrhoids were found during retroflexion. The       hemorrhoids were Grade I (internal hemorrhoids that do not prolapse). Impression:           - One 3 mm polyp in the cecum, removed with a cold                        biopsy forceps. Resected and retrieved.                       - One 4 mm polyp in the sigmoid colon, removed with a                        cold biopsy forceps. Resected and retrieved.                       - Non-bleeding internal hemorrhoids. Recommendation:       - Discharge patient to home.                       - Resume previous diet.                       - Continue present medications.                       - Await pathology results.                       -  Repeat colonoscopy in 5 years for surveillance. Procedure Code(s):    --- Professional ---                       [phone], Colonoscopy, flexible; with biopsy, single or                        multiple Diagnosis Code(s):    --- Professional ---                       Z86.010, Personal history of colonic polyps                       D12.0, Benign neoplasm of cecum                        D12.5, Benign neoplasm of sigmoid colon CPT copyright 2016 American Medical Association. All rights reserved. The codes documented in this report are preliminary and upon coder review may  be revised to meet current compliance requirements. Lucilla Lame MD, MD 11/28/2016 11:43:50 AM This report has been signed electronically. Number of Addenda: 0 Note Initiated On: 11/28/2016 11:23 AM Scope Withdrawal Time: 0 hours 6 minutes 16 seconds  Total Procedure Duration: 0 hours 12 minutes 32 seconds       [facility]

## 2016-11-28 NOTE — Transfer of Care (Signed)
Immediate Anesthesia Transfer of Care Note  Patient: Courtney Mccarty  Procedure(s) Performed: Procedure(s): COLONOSCOPY WITH PROPOFOL (N/A) POLYPECTOMY (N/A)  Patient Location: PACU  Anesthesia Type: General  Level of Consciousness: awake, alert  and patient cooperative  Airway and Oxygen Therapy: Patient Spontanous Breathing and Patient connected to supplemental oxygen  Post-op Assessment: Post-op Vital signs reviewed, Patient's Cardiovascular Status Stable, Respiratory Function Stable, Patent Airway and No signs of Nausea or vomiting  Post-op Vital Signs: Reviewed and stable  Complications: No apparent anesthesia complications

## 2016-11-28 NOTE — Anesthesia Preprocedure Evaluation (Signed)
Anesthesia Evaluation  Patient identified by MRN, date of birth, ID band Patient awake    Reviewed: Allergy & Precautions, H&P , NPO status , Patient's Chart, lab work & pertinent test results, reviewed documented beta blocker date and time   Airway Mallampati: II  TM Distance: >3 FB Neck ROM: full    Dental no notable dental hx.    Pulmonary neg pulmonary ROS,    Pulmonary exam normal breath sounds clear to auscultation       Cardiovascular Exercise Tolerance: Good hypertension,  Rhythm:regular Rate:Normal     Neuro/Psych negative neurological ROS  negative psych ROS   GI/Hepatic negative GI ROS, Neg liver ROS,   Endo/Other  negative endocrine ROS  Renal/GU negative Renal ROS  negative genitourinary   Musculoskeletal   Abdominal   Peds  Hematology negative hematology ROS (+)   Anesthesia Other Findings   Reproductive/Obstetrics negative OB ROS                             Anesthesia Physical Anesthesia Plan  ASA: II  Anesthesia Plan: General   Post-op Pain Management:    Induction:   PONV Risk Score and Plan: Propofol infusion  Airway Management Planned:   Additional Equipment:   Intra-op Plan:   Post-operative Plan:   Informed Consent: I have reviewed the patients History and Physical, chart, labs and discussed the procedure including the risks, benefits and alternatives for the proposed anesthesia with the patient or authorized representative who has indicated his/her understanding and acceptance.   Dental Advisory Given  Plan Discussed with: CRNA  Anesthesia Plan Comments:         Anesthesia Quick Evaluation

## 2016-11-28 NOTE — Anesthesia Postprocedure Evaluation (Signed)
Anesthesia Post Note  Patient: Courtney Mccarty  Procedure(s) Performed: Procedure(s) (LRB): COLONOSCOPY WITH PROPOFOL (N/A) POLYPECTOMY (N/A)  Patient location during evaluation: PACU Anesthesia Type: General Level of consciousness: awake and alert Pain management: pain level controlled Vital Signs Assessment: post-procedure vital signs reviewed and stable Respiratory status: spontaneous breathing, nonlabored ventilation, respiratory function stable and patient connected to nasal cannula oxygen Cardiovascular status: blood pressure returned to baseline and stable Postop Assessment: no signs of nausea or vomiting Anesthetic complications: no    SCOURAS, NICOLE ELAINE

## 2016-11-28 NOTE — H&P (Signed)
   Lucilla Lame, MD [facility] [address] [place], [place] [address] [phone] Fax : [phone]  Primary Care Physician:  Mikey College, NP Primary Gastroenterologist:  Dr. Allen Norris  Pre-Procedure History & Physical: HPI:  Courtney Mccarty is a 56 y.o. female is here for an colonoscopy.   Past Medical History:  Diagnosis Date  . Essential hypertension 10/11/2016    Past Surgical History:  Procedure Laterality Date  . NO PAST SURGERIES      Prior to Admission medications   Medication Sig Start Date End Date Taking? Authorizing Provider  acetaminophen (TYLENOL) 500 MG tablet Take 500 mg by mouth every 6 (six) hours as needed.   Yes [provider]  hydrochlorothiazide (HYDRODIURIL) 12.5 MG tablet Take 1 tablet (12.5 mg total) by mouth daily. 10/07/16  Yes Mikey College, NP  naproxen sodium (ANAPROX) 220 MG tablet Take 220 mg by mouth 2 (two) times daily with a meal.   Yes [provider]    Allergies as of 11/22/2016  . (No Known Allergies)    Family History  Problem Relation Age of Onset  . Diabetes Mother   . Hypertension Mother   . Heart disease Mother   . Depression Mother   . Diabetes Father   . Hypertension Father   . Diabetes Maternal Grandmother   . Cancer Maternal Grandfather   . Diabetes Paternal Grandmother   . Cancer Paternal Grandfather     Social History   Social History  . Marital status: Single    Spouse name: N/A  . Number of children: N/A  . Years of education: N/A   Occupational History  . Not on file.   Social History Main Topics  . Smoking status: Never Smoker  . Smokeless tobacco: Never Used  . Alcohol use 12.0 oz/week    20 Cans of beer per week  . Drug use: No  . Sexual activity: Yes    Birth control/ protection: None   Other Topics Concern  . Not on file   Social History Narrative  . No narrative on file    Review of Systems: See HPI, otherwise negative ROS  Physical Exam: BP  (!) 157/88   Pulse 61   Temp (!) 97.2 F (36.2 C) (Tympanic)   Resp 16   Ht 5' (1.524 m)   Wt 149 lb (67.6 kg)   LMP 04/05/2015 (Approximate)   SpO2 99%   BMI 29.10 kg/m  General:   Alert,  pleasant and cooperative in NAD Head:  Normocephalic and atraumatic. Neck:  Supple; no masses or thyromegaly. Lungs:  Clear throughout to auscultation.    Heart:  Regular rate and rhythm. Abdomen:  Soft, nontender and nondistended. Normal bowel sounds, without guarding, and without rebound.   Neurologic:  Alert and  oriented x4;  grossly normal neurologically.  Impression/Plan: Courtney Mccarty is here for an colonoscopy to be performed for history of polyps  Risks, benefits, limitations, and alternatives regarding  colonoscopy have been reviewed with the patient.  Questions have been answered.  All parties agreeable.   Lucilla Lame, MD  11/28/2016, 10:55 AM

## 2016-11-29 ENCOUNTER — Encounter: Payer: Self-pay | Admitting: Gastroenterology

## 2016-12-06 ENCOUNTER — Ambulatory Visit: Payer: PRIVATE HEALTH INSURANCE

## 2016-12-07 ENCOUNTER — Other Ambulatory Visit: Payer: PRIVATE HEALTH INSURANCE

## 2016-12-07 ENCOUNTER — Ambulatory Visit: Payer: PRIVATE HEALTH INSURANCE

## 2017-01-09 ENCOUNTER — Telehealth: Payer: Self-pay

## 2017-01-09 MED ORDER — HYDROCHLOROTHIAZIDE 12.5 MG PO TABS: 12.5000 mg | ORAL_TABLET | Freq: Every day | ORAL | 3 refills | Status: DC

## 2017-01-09 NOTE — Telephone Encounter (Signed)
Patient called and would like her BP meds called in to [facility] for 90 days.  She no longer has her job and wants to get a refill before she has no insurance.

## 2017-04-05 ENCOUNTER — Telehealth: Payer: Self-pay | Admitting: Nurse Practitioner

## 2017-04-05 NOTE — Telephone Encounter (Signed)
Pt. Called states that she was having headache wanted to know if she need to cute back on her pork or could it be the BP medication she was on. Pt call back # [phone]

## 2017-04-05 NOTE — Telephone Encounter (Signed)
BP medication is not causing her headache.  She had previously tolerated this medication very well.  She can be seen in clinic if her BP is rising.  Higher BP can cause headache.   - BP check today or soonest available with CMA in clinic.  She can also take ibuprofen 600 mg once for one dose today to stop her headache.  Do not continue to use ibuprofen regularly with hypertension.

## 2017-04-07 NOTE — Telephone Encounter (Signed)
Attempted to contact the pt, no answer. LMOm to return my call.  

## 2017-04-10 NOTE — Telephone Encounter (Signed)
The pt was notified. No questions or concerns. 

## 2017-04-20 ENCOUNTER — Ambulatory Visit: Payer: PRIVATE HEALTH INSURANCE | Admitting: Nurse Practitioner

## 2017-04-20 ENCOUNTER — Other Ambulatory Visit: Payer: Self-pay

## 2017-04-20 ENCOUNTER — Encounter: Payer: Self-pay | Admitting: Nurse Practitioner

## 2017-04-20 VITALS — BP 123/69 | HR 66 | Temp 98.4°F | Ht 60.0 in | Wt 155.4 lb

## 2017-04-20 DIAGNOSIS — Z1239 Encounter for other screening for malignant neoplasm of breast: Secondary | ICD-10-CM

## 2017-04-20 DIAGNOSIS — M1812 Unilateral primary osteoarthritis of first carpometacarpal joint, left hand: Secondary | ICD-10-CM

## 2017-04-20 DIAGNOSIS — Z1231 Encounter for screening mammogram for malignant neoplasm of breast: Secondary | ICD-10-CM

## 2017-04-20 NOTE — Patient Instructions (Signed)
Courtney Mccarty, Thank you for coming in to clinic today.  1. You likely have arthritis in your hand.   This will be worsened with repetitive movements. It can be improved by anti-inflammatory medications.  This is naproxen, which you are already taking.  Continue taking 1-2 tablets 1-2 times daily as needed for pain. It can also be improved by stretching and strengthening the thumb joint.  I have placed a referral to occupational therapy.  [facility] will call you to schedule your appointments.   Please schedule a follow-up appointment with Cassell Smiles, AGNP. Return in about 7 months (around 11/18/2017) for annual physical.  If you have any other questions or concerns, please feel free to call the clinic or send a message through [place]. You may also schedule an earlier appointment if necessary.  You will receive a survey after today's visit either digitally by e-mail or paper by [employer]. Your experiences and feedback matter to [place].  Please respond so we know how we are doing as we provide care for you.   Cassell Smiles, DNP, AGNP-BC Adult Gerontology Nurse Practitioner [facility], [facility]    What You Need to Know About Osteoarthritis Osteoarthritis is a type of arthritis that affects tissue that covers the ends of bones in joints (cartilage). Cartilage acts as a cushion between the bones and helps them move smoothly. Osteoarthritis results when cartilage in the joints gets worn down. Osteoarthritis is sometimes called "wear and tear" arthritis. Osteoarthritis can affect any joint and can make movement painful. Hips, knees, fingers, and toes are some of the joints that are most often affected by osteoarthritis. You may be more likely to develop osteoarthritis if:  You are middle-aged or older.  You are obese.  You have injured a joint or had surgery on a joint.  You have a family history of osteoarthritis.  How can osteoarthritis affect me? Osteoarthritis can  cause:  Pain and swelling in your joint.  Difficulty moving your joint.  A grating or scraping feeling inside the joint when you move it.  Popping or creaking sounds when you move.  This condition can make it harder to do things that you need or want to do each day. Osteoarthritis in a major joint, such as your knee or hip, can make it painful to walk or exercise. If you have osteoarthritis in your hands, you might not be able to grip items, twist your hand, or control small movements of your hands and fingers (fine motor skills). Over time, osteoarthritis could cause you to be less physically active. Being less active increases your risk for other long-term (chronic) health problems, such as diabetes and heart disease. What lifestyle changes can be made? You can lessen the impact that osteoarthritis has on your daily life by:  Switching to low-impact activities that do not put repeated pressure on your joints. For example, if you usually run or jog for exercise, try swimming or riding a bike instead.  Staying active. Build up to at least 150 minutes of physical activity each week to keep your joints healthy and keep your body strong.  Losing weight. If you are overweight or obese, losing weight can take pressure off of your joints. If you need help with weight loss, talk with your health care provider or a diet and nutrition specialist (dietitian).  What other changes can be made? You can also lessen the effect of osteoarthritis by:  Using assistive devices. Sometimes a brace, wrap, splint, specialized glove, or  cane can help. Talk with your health care provider or physical therapist about when and how to use these.  Working with a physical therapist who can help you find ways to do your daily activities without harming your joints. A physical therapist can also teach you exercises and stretches to strengthen the muscles that support your joints.  Treating pain and inflammation. Take  over-the-counter and prescription medicines for pain and inflammation only as told by your health care provider. If directed, you may put ice on the affected joint: ? If you have a removable assistive device, remove it as told by your health care provider. ? Put ice in a plastic bag. ? Place a towel between your skin and the bag. ? Leave the ice on for 20 minutes, 2-3 times a day.  If other measures do not work, you may need joint surgery, such as joint replacement. What can happen if changes are not made? Osteoarthritis is a condition that gets worse over time (a progressive condition). If you do not take steps to strengthen your body and to slow down the progress of the disease, your condition may get worse more quickly. Your joints may stiffen and become swollen, which will make them painful and hard to move. Where to find more information: You can learn more about osteoarthritis from:  The [place]: www.RadioSc[employer].is  [employer] of Arthritis and Musculoskeletal and Skin Diseases: www.niams.CityPerson.tn  Contact a health care provider if:  You cannot do your normal activities comfortably.  Your joint does not function at all.  Your pain is interfering with your sleep.  You are gaining weight.  Your joint appears to be changing in shape, instead of just being swollen and sore. Summary  Osteoarthritis is a painful joint disease that gets worse over time.  This condition can lead to other long-term (chronic) health problems.  There are changes that you can make to slow down the progression of the disease. This information is not intended to replace advice given to you by your health care provider. Make sure you discuss any questions you have with your health care provider. Document Released: 11/10/2015 Document Revised: 11/12/2015 Document Reviewed: 11/10/2015 Elsevier Interactive  Patient Education  2018 [employer].

## 2017-04-20 NOTE — Progress Notes (Signed)
Subjective:    Patient ID: Courtney Mccarty, female    DOB: [birth year], 57 y.o.   MRN: [id number]  Courtney Mccarty is a 57 y.o. female presenting on 04/20/2017 for Headache (subsided after the pt changed her diet. Pt reports she stop eatting pork.) and Arm Pain (pt states the pain radiates down the arm and in the hand on the left side.)   HPI  Headaches - Resolved after reducing pork and changing seasoning to low sodium seasonings.  Hypertension - She is not checking BP at home or outside of clinic.    - Current medications: hydrochlorothiazide 12.5 mg once daily, tolerating well without side effects - She is not currently symptomatic. - Pt denies headache, lightheadedness, dizziness, changes in vision, chest tightness/pressure, palpitations, leg swelling, sudden loss of speech or loss of consciousness. - She  reports no regular exercise routine outside of work at [employer].  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 - Her diet is moderate in salt, moderate in fat, and moderate in carbohydrates.  Left hand pain - Pt has been having left hand pain.  Started prior to last office visit.  Has not improved, but occasional worsening with pain in left thumb.  Decreased grip strength.   - R elbow was hurting until 1 week ago, but has resolved. 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   - Uses smartphone "some" and demonstrates holding same hand while scrolling with thumb.   Past Medical History:  Diagnosis Date  . Essential hypertension 10/11/2016   Past Surgical History:  Procedure Laterality Date  . COLONOSCOPY WITH PROPOFOL N/A 11/28/2016   Procedure: COLONOSCOPY WITH PROPOFOL;  Surgeon: Lucilla Lame, MD;  Location: [facility];  Service: Gastroenterology;  Laterality: N/A;  . NO PAST SURGERIES    . POLYPECTOMY N/A 11/28/2016   Procedure: POLYPECTOMY;  Surgeon: Lucilla Lame, MD;  Location: [facility];   Service: Gastroenterology;  Laterality: N/A;   Social History   Socioeconomic History  . Marital status: Single    Spouse name: Not on file  . Number of children: Not on file  . Years of education: Not on file  . Highest education level: Not on file  Social Needs  . Financial resource strain: Not on file  . Food insecurity - worry: Not on file  . Food insecurity - inability: Not on file  . Transportation needs - medical: Not on file  . Transportation needs - non-medical: Not on file  Occupational History  . Not on file  Tobacco Use  . Smoking status: Never Smoker  . Smokeless tobacco: Never Used  Substance and Sexual Activity  . Alcohol use: Yes    Alcohol/week: 12.0 oz    Types: 20 Cans of beer per week  . Drug use: No  . Sexual activity: Yes    Birth control/protection: None  Other Topics Concern  . Not on file  Social History Narrative  . Not on file   Family History  Problem Relation Age of Onset  . Diabetes Mother   . Hypertension Mother   . Heart disease Mother   . Depression Mother   . Diabetes Father   . Hypertension Father   . Diabetes Maternal Grandmother   . Cancer Maternal Grandfather   . Diabetes Paternal Grandmother   . Cancer Paternal Grandfather    Current Outpatient Medications on File Prior to Visit  Medication Sig  .  acetaminophen (TYLENOL) 500 MG tablet Take 500 mg by mouth every 6 (six) hours as needed.  . hydrochlorothiazide (HYDRODIURIL) 12.5 MG tablet Take 1 tablet (12.5 mg total) by mouth daily.  . naproxen sodium (ANAPROX) 220 MG tablet Take 220 mg by mouth 2 (two) times daily with a meal.   No current facility-administered medications on file prior to visit.     Review of Systems Per HPI unless specifically indicated above     Objective:    BP 123/69 (BP Location: Right Arm, Patient Position: Sitting, Cuff Size: Normal)   Pulse 66   Temp 98.4 F (36.9 C) (Oral)   Ht 5' (1.524 m)   Wt 155 lb 6.4 oz (70.5 kg)   LMP  04/05/2015 (Approximate)   BMI 30.35 kg/m   Wt Readings from Last 3 Encounters:  04/20/17 155 lb 6.4 oz (70.5 kg)  11/28/16 149 lb (67.6 kg)  11/08/16 148 lb 6.4 oz (67.3 kg)    Physical Exam  General - overweight, well-appearing, NAD HEENT - Normocephalic, atraumatic Neck - supple, non-tender, no LAD, no thyromegaly, no carotid bruit Heart - RRR, no murmurs heard Lungs - Clear throughout all lobes, no wheezing, crackles, or rhonchi. Normal work of breathing. Musculoskeletal - Left Hand/Wrist Inspection: Normal appearance, symmetrical, no bulky MCP joints, no edema or erythema. Palpation: Tender hand/carpal bones at MCP/base of thumb.  Nontender hand and wrist at any other location. No distinct anatomical snuff box or scaphoid tenderness. Nontender over APL / EPB tendons radially. ROM: full active wrist ROM flex / ext, ulnar / radial deviation, pain with radial deviation.  Thumb ROM flex/extension at MCP joint is limited by pain and tension of muscles/joint. Special Testing: Negative Tinel's median nerve Strength: 4/5 grip, 3/5 thumb opposition, 5/5 wrist flex/ext Neurovascular: distally intact  Extremeties - non-tender, no edema, cap refill < 2 seconds, peripheral pulses intact +2 bilaterally Skin - warm, dry Neuro - awake, alert, oriented x3, normal gait Psych - Normal mood and affect, normal behavior    Results for orders placed or performed in visit on 11/08/16  PAP, Thin Prep w/HPV rflx HPV Type 16/18  Result Value Ref Range   HPV DNA High Risk NOT DETECTED    SOURCE Endocervical    Specimen adequacy:     FINAL DIAGNOSIS:     COMMENTS:     Cytotechnologist:     Pathologist:        Assessment & Plan:   Problem List Items Addressed This Visit    None    Visit Diagnoses    Arthritis of carpometacarpal (CMC) joint of left thumb    -  Primary Arthritis vs sprain of CMC joint of left thumb.  Pain likely self-limited due to overuse syndrome complicated by smartphone  use.  Plan:  1. Treat with OTC pain meds (acetaminophen and ibuprofen).  Discussed alternate dosing and max dosing. 2. Apply heat and/or ice to affected area. 3. May also apply a muscle rub with lidocaine or lidocaine patch after heat or ice. 4. Referral to Occupational therapy for optimizing workplace movements to improve symptoms.   - Rest and avoid using smartphone for extended periods of time and scrolling with same hand that is holding the phone 5. Follow up 2-4 weeks prn    Relevant Orders   Ambulatory referral to Occupational Therapy   Hypertension Controlled hypertension.  BP at goal.  Pt is not currently working on lifestyle modifications.  Taking medications tolerating well without side effects. No complications.  Plan: 1. Continue taking hydrochlorothiazide 12. 5mg  once daily 2. Labs obtained in August - normal kidney function  3. Encouraged heart healthy diet and increasing exercise to 30 minutes most days of the week. 4. Check BP 1-2 x per week at home, keep log, and bring to clinic at next appointment. 5. Follow up 7 months.         Follow up plan: Return in about 7 months (around 11/18/2017) for annual physical.  Cassell Smiles, DNP, AGPCNP-BC Adult Gerontology Primary Care Nurse Practitioner [facility] 04/20/2017, 10:17 AM

## 2017-05-08 ENCOUNTER — Encounter: Payer: Self-pay | Admitting: Nurse Practitioner

## 2017-10-04 ENCOUNTER — Telehealth: Payer: Self-pay | Admitting: Nurse Practitioner

## 2017-10-04 DIAGNOSIS — I10 Essential (primary) hypertension: Secondary | ICD-10-CM

## 2017-10-04 MED ORDER — HYDROCHLOROTHIAZIDE 12.5 MG PO TABS: 12.5000 mg | ORAL_TABLET | Freq: Every day | ORAL | 0 refills | Status: DC

## 2017-10-04 NOTE — Telephone Encounter (Signed)
Pt. called requesting refill on  Hydrodiuril 12.5 called into [facility]

## 2017-11-21 ENCOUNTER — Encounter: Payer: PRIVATE HEALTH INSURANCE | Admitting: Nurse Practitioner

## 2018-01-18 ENCOUNTER — Telehealth: Payer: Self-pay | Admitting: Nurse Practitioner

## 2018-01-18 DIAGNOSIS — I10 Essential (primary) hypertension: Secondary | ICD-10-CM

## 2018-01-18 MED ORDER — HYDROCHLOROTHIAZIDE 12.5 MG PO TABS: 12.5000 mg | ORAL_TABLET | Freq: Every day | ORAL | 0 refills | Status: DC

## 2018-01-18 NOTE — Telephone Encounter (Signed)
Pt needs a refill on hydrochlorothiazide sent to [facility].  Her number [phone]

## 2018-01-19 ENCOUNTER — Telehealth: Payer: Self-pay | Admitting: Nurse Practitioner

## 2018-01-19 NOTE — Telephone Encounter (Signed)
Called patient to notify Rx was send yesterday.

## 2018-01-19 NOTE — Telephone Encounter (Signed)
Pt called requesting refill on  Hydrodiuril 12.5 called into [facility]. Pt. Call back # is  [phone]

## 2018-04-27 ENCOUNTER — Other Ambulatory Visit: Payer: Self-pay | Admitting: Nurse Practitioner

## 2018-04-27 DIAGNOSIS — I10 Essential (primary) hypertension: Secondary | ICD-10-CM

## 2018-04-27 MED ORDER — HYDROCHLOROTHIAZIDE 12.5 MG PO TABS: 12.5000 mg | ORAL_TABLET | Freq: Every day | ORAL | 0 refills | Status: DC

## 2018-04-27 NOTE — Telephone Encounter (Signed)
Pt called requesting refill on her BP medication.

## 2018-04-30 ENCOUNTER — Telehealth: Payer: Self-pay | Admitting: Nurse Practitioner

## 2018-04-30 NOTE — Telephone Encounter (Signed)
Pt stop by  Requesting refill on BP mediation

## 2018-04-30 NOTE — Telephone Encounter (Signed)
Refill was sent on 04/27/18 to [facility].  Patient was notified to contact pharmacy to get her refill.

## 2018-05-01 ENCOUNTER — Other Ambulatory Visit: Payer: Self-pay

## 2018-05-01 ENCOUNTER — Encounter: Payer: Self-pay | Admitting: Nurse Practitioner

## 2018-05-01 ENCOUNTER — Ambulatory Visit (INDEPENDENT_AMBULATORY_CARE_PROVIDER_SITE_OTHER): Payer: Managed Care, Other (non HMO) | Admitting: Nurse Practitioner

## 2018-05-01 VITALS — BP 140/80 | HR 62 | Resp 16 | Ht 60.0 in | Wt 162.4 lb

## 2018-05-01 DIAGNOSIS — Z789 Other specified health status: Secondary | ICD-10-CM

## 2018-05-01 DIAGNOSIS — Z114 Encounter for screening for human immunodeficiency virus [HIV]: Secondary | ICD-10-CM | POA: Diagnosis not present

## 2018-05-01 DIAGNOSIS — Z1239 Encounter for other screening for malignant neoplasm of breast: Secondary | ICD-10-CM | POA: Diagnosis not present

## 2018-05-01 DIAGNOSIS — I10 Essential (primary) hypertension: Secondary | ICD-10-CM | POA: Diagnosis not present

## 2018-05-01 DIAGNOSIS — Z0000 Encounter for general adult medical examination without abnormal findings: Secondary | ICD-10-CM

## 2018-05-01 MED ORDER — HYDROCHLOROTHIAZIDE 12.5 MG PO TABS: 12.5000 mg | ORAL_TABLET | Freq: Every day | ORAL | 5 refills | Status: DC

## 2018-05-01 NOTE — Patient Instructions (Addendum)
Courtney Mccarty,   Thank you for coming in to clinic today.  1. Blood Pressure Goal: less than 130/80 - Call clinic if you are higher than this when you are home and on medications.  2. Continue hydrochlorothiazide 12.5 mg once daily.  Please schedule a follow-up appointment with Cassell Smiles, AGNP. Return in about 6 months (around 10/30/2018) for hypertension.  If you have any other questions or concerns, please feel free to call the clinic or send a message through [place]. You may also schedule an earlier appointment if necessary.  You will receive a survey after today's visit either digitally by e-mail or paper by [employer]. Your experiences and feedback matter to [place].  Please respond so we know how we are doing as we provide care for you.   Cassell Smiles, DNP, AGNP-BC Adult Gerontology Nurse Practitioner [place]

## 2018-05-01 NOTE — Progress Notes (Signed)
Subjective:    Patient ID: Courtney Mccarty, female    DOB: 08-12-1960, 58 y.o.   MRN: [id number]  Courtney Mccarty is a 58 y.o. female presenting on 05/01/2018 for Annual Exam and Hypertension   HPI Hypertension - She is not checking BP at home or outside of clinic.    - Current medications: HCTZ 12.5 mg once daily, but has been out for about 3-4 days, tolerating well without side effects. Patient asks how she is supposed to do this - ie get her medication when she lives primarily in [place].  Instructed patient to transfer her prescription to her local [place]. - Patient states that she is concerned for heart enlargement now that she is taking blood pressure medications.  States she has family history of heart attack and stroke and doesn't want these.  Also states she wants to have valves checked to make sure she won't have a heart attack or stroke. - She is not currently symptomatic. - Pt denies headache, lightheadedness, dizziness, changes in vision, chest tightness/pressure, palpitations, leg swelling, sudden loss of speech or loss of consciousness. - She  reports no regular exercise routine. - Her diet is moderate in salt, moderate in fat, and moderate in carbohydrates.   Annual Physical Exam Patient has been feeling well.  They have no other acute concerns today. Sleeps 4-5 hours per day interrupted.  Disrupted because of roommates.  HEALTH MAINTENANCE: Weight/BMI: gradually increasing Physical activity: Is increasing activity Diet: has significantly reduced fast food in last couple of months - Cooks most food from home now - Patient has difficulty telling me her eating patterns as she does not understand the concept of a snack, but admits to eating Chex cereal between meals. Seatbelt: always Sunscreen: not regular PAP: last 11/2016  Mammogram: due - [place] Mccarty will need to find a mammography center and have testing HIV/HEP C: HIV neg 2018 - patient reque, [facility] due Optometry: not  regular Dentistry: not regular - did have her broken tooth removed.  VACCINES: Tetanus: given 2018 Influenza: declined  Social History   Tobacco Use  . Smoking status: Never Smoker  . Smokeless tobacco: Never Used  Substance Use Topics  . Alcohol use: Yes    Alcohol/week: 20.0 standard drinks    Types: 20 Cans of beer per week  . Drug use: No    Review of Systems  Constitutional: Negative for chills and fever.  HENT: Negative for congestion and sore throat.   Eyes: Negative for pain.  Respiratory: Negative for cough, shortness of breath and wheezing.   Cardiovascular: Negative for chest pain, palpitations and leg swelling.  Gastrointestinal: Negative for abdominal pain, blood in stool, constipation, diarrhea, nausea and vomiting.  Endocrine: Negative for polydipsia.  Genitourinary: Negative for dysuria, frequency, hematuria, urgency and vaginal bleeding.  Musculoskeletal: Negative for back pain, myalgias and neck pain.  Skin: Negative for rash.  Allergic/Immunologic: Negative for environmental allergies.  Neurological: Negative for dizziness, weakness and headaches.  Hematological: Does not bruise/bleed easily.  Psychiatric/Behavioral: Negative for dysphoric mood and suicidal ideas. The patient is not nervous/anxious.    Per HPI unless specifically indicated above     Objective:    BP 140/80 (BP Location: Left Arm, Patient Position: Sitting, Cuff Size: Normal)   Pulse 62   Resp 16   Ht 5' (1.524 m)   Wt 162 lb 6.4 oz (73.7 kg)   LMP 04/05/2015 (Approximate)   SpO2 99%   BMI 31.72 kg/m   Wt Readings from  Last 3 Encounters:  05/01/18 162 lb 6.4 oz (73.7 kg)  04/20/17 155 lb 6.4 oz (70.5 kg)  11/28/16 149 lb (67.6 kg)    Physical Exam Vitals signs and nursing note reviewed.  Constitutional:      General: She is not in acute distress.    Appearance: She is well-developed.  HENT:     Head: Normocephalic and atraumatic.     Right Ear: External ear normal.      Left Ear: External ear normal.     Nose: Nose normal.     Mouth/Throat:     Mouth: Mucous membranes are moist.     Pharynx: Oropharynx is clear.  Eyes:     Conjunctiva/sclera: Conjunctivae normal.     Pupils: Pupils are equal, round, and reactive to light.  Neck:     Musculoskeletal: Normal range of motion and neck supple.     Thyroid: No thyromegaly.     Vascular: No JVD.     Trachea: No tracheal deviation.  Cardiovascular:     Rate and Rhythm: Normal rate and regular rhythm.     Heart sounds: Normal heart sounds. No murmur. No friction rub. No gallop.   Pulmonary:     Effort: Pulmonary effort is normal. No respiratory distress.     Breath sounds: Normal breath sounds.  Abdominal:     General: Bowel sounds are normal. There is no distension.     Palpations: Abdomen is soft.     Tenderness: There is no abdominal tenderness.  Musculoskeletal: Normal range of motion.  Lymphadenopathy:     Cervical: No cervical adenopathy.  Skin:    General: Skin is warm and dry.     Capillary Refill: Capillary refill takes less than 2 seconds.  Neurological:     General: No focal deficit present.     Mental Status: She is alert and oriented to person, place, and time.     Cranial Nerves: No cranial nerve deficit.  Psychiatric:        Mood and Affect: Mood normal.        Behavior: Behavior normal.        Thought Content: Thought content normal.        Cognition and Memory: Cognition is impaired.        Judgment: Judgment is inappropriate.       Assessment & Plan:   Problem List Items Addressed This Visit      Cardiovascular and Mediastinum   Essential hypertension Uncontrolled hypertension. Patient off medication for 3-4 days.  BP goal < 130/80.  Pt is not working on lifestyle modifications.  Taking medications tolerating well without side effects. Complications: poor knowledge of disease, heart healthy diet, healthy lifestyle factors  Plan: 1. Continue taking hydrochlorothiazide 12.5  mg once daily.  Unable to fully assess impact of medication to make changes without patient actively taking med. 2. Obtain labs today for kidney function  3. Encouraged heart healthy diet and increasing exercise to 30 minutes most days of the week. 4. Check BP 1-2 x per week at home, keep log, and bring to clinic at next appointment.  Reviewed BP goal with patient. 5. Follow up 6 months.     Relevant Medications   hydrochlorothiazide (HYDRODIURIL) 12.5 MG tablet   Other Relevant Orders   COMPLETE METABOLIC PANEL WITH GFR    Other Visit Diagnoses    Annual physical exam    -  Primary Physical exam with no new findings.  Well adult with no  acute concerns.  Patient with very poor health knowledge and health literacy.  Unable to comprehend new changes for managing medications.  Plan: 1. Obtain health maintenance screenings as above according to age. - Increase physical activity to 30 minutes most days of the week.  - Eat healthy diet high in vegetables and fruits; low in refined carbohydrates.  Likely needs to reduce snacks between meals to reduce caloric intake for weight stability or weight loss of 10 lbs in next year. - Due for mammogram - order placed for External.  Patient will need to find mammography center to schedule her testing in [place]. - labs today for screenings including HIV/Hep C - Encouraged patient to reduce risk of heart attack and stroke to live healthy lifestyle / lose weight / screen/manage cholesterol. 2. Return 1 year for annual physical.     Relevant Orders   TSH   Lipid panel   COMPLETE METABOLIC PANEL WITH GFR   CBC with Differential/Platelet   Hemoglobin A1c   Hepatitis C antibody   Encounter for Breast Cancer Screening        MM digital screening bilateral  Screening for HIV without presence of risk factors       Relevant Orders   HIV Antibody (routine testing w rflx)      Meds ordered this encounter  Medications  . hydrochlorothiazide (HYDRODIURIL)  12.5 MG tablet    Sig: Take 1 tablet (12.5 mg total) by mouth daily.    Dispense:  30 tablet    Refill:  5    Order Specific Question:   Supervising Provider    Answer:   Olin Hauser [[id number]]   Follow up plan: Return in about 6 months (around 10/30/2018) for hypertension.  Cassell Smiles, DNP, AGPCNP-BC Adult Gerontology Primary Care Nurse Practitioner [facility] 05/01/2018, 8:11 AM

## 2018-05-02 LAB — HEMOGLOBIN A1C
Hgb A1c MFr Bld: 6.3 % of total Hgb — ABNORMAL HIGH (ref <5.7)
Mean Plasma Glucose: 134 (calc)
eAG (mmol/L): 7.4 (calc)

## 2018-05-02 LAB — CBC WITH DIFFERENTIAL/PLATELET
Absolute Monocytes: 354 cells/uL (ref 200–950)
Basophils Absolute: 41 cells/uL (ref 0–200)
Basophils Relative: 0.6 %
Eosinophils Absolute: 122 cells/uL (ref 15–500)
Eosinophils Relative: 1.8 %
HCT: 37.9 % (ref 35.0–45.0)
Hemoglobin: 13.2 g/dL (ref 11.7–15.5)
Lymphs Abs: 2128 cells/uL (ref 850–3900)
MCH: 29.6 pg (ref 27.0–33.0)
MCHC: 34.8 g/dL (ref 32.0–36.0)
MCV: 85 fL (ref 80.0–100.0)
MPV: 10.9 fL (ref 7.5–12.5)
Monocytes Relative: 5.2 %
Neutro Abs: 4155 cells/uL (ref 1500–7800)
Neutrophils Relative %: 61.1 %
Platelets: 258 10*3/uL (ref 140–400)
RBC: 4.46 10*6/uL (ref 3.80–5.10)
RDW: 12.9 % (ref 11.0–15.0)
Total Lymphocyte: 31.3 %
WBC: 6.8 10*3/uL (ref 3.8–10.8)

## 2018-05-02 LAB — COMPLETE METABOLIC PANEL WITH GFR
AG Ratio: 1.3 (calc) (ref 1.0–2.5)
ALT: 41 U/L — ABNORMAL HIGH (ref 6–29)
AST: 29 U/L (ref 10–35)
Albumin: 4.4 g/dL (ref 3.6–5.1)
Alkaline phosphatase (APISO): 71 U/L (ref 33–130)
BUN: 11 mg/dL (ref 7–25)
CO2: 25 mmol/L (ref 20–32)
Calcium: 9.6 mg/dL (ref 8.6–10.4)
Chloride: 107 mmol/L (ref 98–110)
Creat: 0.73 mg/dL (ref 0.50–1.05)
GFR, Est African American: 107 mL/min/{1.73_m2} (ref >=60)
GFR, Est Non African American: 92 mL/min/{1.73_m2} (ref >=60)
Globulin: 3.3 g/dL (calc) (ref 1.9–3.7)
Glucose, Bld: 114 mg/dL — ABNORMAL HIGH (ref 65–99)
Potassium: 3.5 mmol/L (ref 3.5–5.3)
Sodium: 144 mmol/L (ref 135–146)
Total Bilirubin: 0.3 mg/dL (ref 0.2–1.2)
Total Protein: 7.7 g/dL (ref 6.1–8.1)

## 2018-05-02 LAB — TSH: TSH: 3.34 mIU/L (ref 0.40–4.50)

## 2018-05-02 LAB — LIPID PANEL
Cholesterol: 167 mg/dL (ref <200)
HDL: 36 mg/dL — ABNORMAL LOW (ref >50)
LDL Cholesterol (Calc): 99 mg/dL (calc)
Non-HDL Cholesterol (Calc): 131 mg/dL (calc) — ABNORMAL HIGH (ref <130)
Total CHOL/HDL Ratio: 4.6 (calc) (ref <5.0)
Triglycerides: 204 mg/dL — ABNORMAL HIGH (ref <150)

## 2018-05-02 LAB — HIV ANTIBODY (ROUTINE TESTING W REFLEX): HIV 1&2 Ab, 4th Generation: NONREACTIVE

## 2018-05-02 LAB — HEPATITIS C ANTIBODY
Hepatitis C Ab: NONREACTIVE
SIGNAL TO CUT-OFF: 0.02 (ref <1.00)

## 2018-05-28 ENCOUNTER — Telehealth: Payer: Self-pay | Admitting: Nurse Practitioner

## 2018-05-28 NOTE — Telephone Encounter (Signed)
The pt called with concerns of a possible UTI.  She currently works in [place], but will be back in [place] on next Monday. I recommended the patient to go to an Urgent Care, Min[facility] clinic or Acute Care in [place]. She verbalize understanding, no questions or concerns.

## 2018-05-28 NOTE — Telephone Encounter (Signed)
Pt works in [place].  She thinks she has a UTI and can't schedule here until Friday.  Please call [phone]

## 2018-06-06 ENCOUNTER — Ambulatory Visit (INDEPENDENT_AMBULATORY_CARE_PROVIDER_SITE_OTHER): Payer: Managed Care, Other (non HMO) | Admitting: Nurse Practitioner

## 2018-06-06 ENCOUNTER — Encounter: Payer: Self-pay | Admitting: Nurse Practitioner

## 2018-06-06 VITALS — BP 129/74 | HR 68 | Temp 98.6°F | Ht 60.0 in | Wt 164.8 lb

## 2018-06-06 DIAGNOSIS — N750 Cyst of Bartholin's gland: Secondary | ICD-10-CM

## 2018-06-06 DIAGNOSIS — R350 Frequency of micturition: Secondary | ICD-10-CM | POA: Diagnosis not present

## 2018-06-06 MED ORDER — MUPIROCIN 2 % EX OINT: 1.0000 "application " | TOPICAL_OINTMENT | Freq: Two times a day (BID) | CUTANEOUS | 0 refills | Status: DC

## 2018-06-06 NOTE — Progress Notes (Signed)
Subjective:    Patient ID: Courtney Mccarty, female    DOB: 06/02/1960, 58 y.o.   MRN: [id number]  Courtney Mccarty is a 58 y.o. female presenting on 06/06/2018 for Bartholin's Cyst (vaginal cyst. Pt states she get recurring cyst x 1 week, that shes had to get lancets in the past. ) and Urinary Tract Infection (urinary frequency)   HPI Vaginal pain Chronic history of bartholin cyst.  Has used antibacterial ointment and has had partial resolution.  Now has pain only with wiping after urination. Patient has increased urinary frequency.   Social History   Tobacco Use  . Smoking status: Never Smoker  . Smokeless tobacco: Never Used  Substance Use Topics  . Alcohol use: Yes    Alcohol/week: 20.0 standard drinks    Types: 20 Cans of beer per week  . Drug use: No    Review of Systems Per HPI unless specifically indicated above     Objective:    BP 129/74 (BP Location: Right Arm, Patient Position: Sitting, Cuff Size: Normal)   Pulse 68   Temp 98.6 F (37 C) (Oral)   Ht 5' (1.524 m)   Wt 164 lb 12.8 oz (74.8 kg)   LMP 04/05/2015 (Approximate)   BMI 32.19 kg/m   Wt Readings from Last 3 Encounters:  06/06/18 164 lb 12.8 oz (74.8 kg)  05/01/18 162 lb 6.4 oz (73.7 kg)  04/20/17 155 lb 6.4 oz (70.5 kg)    Physical Exam Vitals signs reviewed.  Constitutional:      General: She is not in acute distress.    Appearance: She is well-developed.  HENT:     Head: Normocephalic and atraumatic.  Cardiovascular:     Rate and Rhythm: Normal rate and regular rhythm.     Pulses:          Radial pulses are 2+ on the right side and 2+ on the left side.       Posterior tibial pulses are 1+ on the right side and 1+ on the left side.     Heart sounds: Normal heart sounds, S1 normal and S2 normal.  Pulmonary:     Effort: Pulmonary effort is normal. No respiratory distress.     Breath sounds: Normal breath sounds and air entry.  Genitourinary:   Musculoskeletal:     Right lower leg: No  edema.     Left lower leg: No edema.  Skin:    General: Skin is warm and dry.     Capillary Refill: Capillary refill takes less than 2 seconds.  Neurological:     General: No focal deficit present.     Mental Status: She is alert and oriented to person, place, and time. Mental status is at baseline.  Psychiatric:        Attention and Perception: Attention normal.        Mood and Affect: Mood and affect normal.        Behavior: Behavior normal. Behavior is cooperative.        Thought Content: Thought content normal.        Judgment: Judgment normal.    Results for orders placed or performed in visit on 06/06/18  POCT Urinalysis Dipstick  Result Value Ref Range   Color, UA yellow    Clarity, UA clear    Glucose, UA Negative Negative   Bilirubin, UA Negative    Ketones, UA Negative    Spec Grav, UA 1.015 1.010 - 1.025   Blood, UA Negative  pH, UA 5.0 5.0 - 8.0   Protein, UA Negative Negative   Urobilinogen, UA 0.2 0.2 or 1.0 E.U./dL   Nitrite, UA Negative    Leukocytes, UA Negative Negative   Appearance Clear    Odor        Assessment & Plan:   Problem List Items Addressed This Visit    None    Visit Diagnoses    Urinary frequency    -  Primary   Relevant Orders   POCT Urinalysis Dipstick (Completed)   Bartholin cyst        Patient with mild dysuria/burning and vaginal pain from bartholin cyst.  Likely will resolve without I&D with proper care.  Plan: 1. Encouraged patient to use warm compress bid x 10-14 days or until resolves 2. START Mupirocin ointment bid x 14 days to bartholin cyst 3. If not resolving, will need I&D by OB-GYN. Patient currently refuses to have I&D, reassured her it may resolve without.    Meds ordered this encounter  Medications  . DISCONTD: mupirocin ointment (BACTROBAN) 2 %    Sig: Place 1 application into the nose 2 (two) times daily for 14 days.    Dispense:  22 g    Refill:  0    Order Specific Question:   Supervising Provider     Answer:   Olin Hauser [[id number]]    Follow up plan: Return 2 weeks if symptoms worsen or fail to improve.  Cassell Smiles, DNP, AGPCNP-BC Adult Gerontology Primary Care Nurse Practitioner [facility] 06/06/2018, 11:49 AM

## 2018-06-06 NOTE — Patient Instructions (Addendum)
Courtney Mccarty,   Thank you for coming in to clinic today.  1. You have a small inflamed bartholin cyst that may resolve on its own.  - START using mupirocin ointment twice daily for 14 days.  Apply this after a warm compress.  - START placing a warm wet washcloth on your cyst for 15 minutes twice a day for 14 days.  If not improving, it will need to be lanced by a GYN provider.  Please call us for a referral if it is not resolved in 14 days.  Please schedule a follow-up appointment with Cassell Smiles, AGNP. Return 2 weeks if symptoms worsen or fail to improve.  If you have any other questions or concerns, please feel free to call the clinic or send a message through [facility]. You may also schedule an earlier appointment if necessary.  You will receive a survey after today's visit either digitally by e-mail or paper by [employer]. Your experiences and feedback matter to [place].  Please respond so we know how we are doing as we provide care for you.   Cassell Smiles, DNP, AGNP-BC Adult Gerontology Nurse Practitioner [facility], [facility]   Bartholin's Cyst or Abscess  A Bartholin's cyst is a fluid-filled sac that forms on a Bartholin's gland. Bartholin's glands are small glands in the folds of skin around the opening of the vagina (labia). This type of cyst causes a bulge or lump near the lower opening of the vagina. If you have a cyst that is small and not infected, you may be able to take care of it at home. If your cyst gets infected, it may cause pain and your doctor may need to drain it. An infected Bartholin's cyst is called a Bartholin's abscess. Follow these instructions at home: Medicines  Take over-the-counter and prescription medicines only as told by your doctor.  If you were prescribed an antibiotic medicine, take it as told by your doctor. Do not stop taking the antibiotic even if you start to feel better. Managing pain and swelling  Try sitz baths to help with  pain and swelling. A sitz bath is a warm water bath in which the water only comes up to your hips and should cover your buttocks. You may take sitz baths a few times a day.  Put heat on the affected area as often as needed. Use the heat source that your doctor recommends, such as a moist heat pack or a heating pad. ? Place a towel between your skin and the heat source. ? Leave the heat on for 20-30 minutes. ? Remove the heat if your skin turns bright red. This is especially important if you cannot feel pain, heat, or cold. You may have a greater risk of getting burned. General instructions  If your cyst or abscess was drained: ? Follow instructions from your doctor about how to take care of your wound. ? Use feminine pads to absorb any fluid.  Do not push on or squeeze your cyst.  Do not have sex until the cyst has gone away or your wound from drainage has healed.  Take these steps to help prevent a Bartholin's cyst from returning, and to prevent other Bartholin's cysts from forming: ? Take a bath or shower once a day. Clean your vaginal area with mild soap and water when you bathe. ? Practice safe sex to prevent STIs (sexually transmitted infections). Talk with your doctor about how to prevent STIs and which forms of birth control (  contraception) may be best for you.  Keep all follow-up visits as told by your doctor. This is important. Contact a doctor if:  You have a fever.  You get redness, swelling, or pain around your cyst.  You have fluid, blood, pus, or a bad smell coming from your cyst.  You have a cyst that gets larger or comes back. Summary  A Bartholin's cyst is a fluid-filled sac that forms on a Bartholin's gland. These small glands are found in the folds of skin around the opening of the vagina (labia).  This type of cyst causes a bulge or lump near the lower opening of the vagina. An infected Bartholin's cyst is called a Bartholin's abscess.  Try sitz baths a few  times a day to help with pain and swelling.  Do not push on or squeeze your cyst. This information is not intended to replace advice given to you by your health care provider. Make sure you discuss any questions you have with your health care provider. Document Released: 06/17/2008 Document Revised: 12/21/2016 Document Reviewed: 12/21/2016 Elsevier Interactive Patient Education  2019 [employer].

## 2018-06-07 ENCOUNTER — Other Ambulatory Visit: Payer: Self-pay | Admitting: Nurse Practitioner

## 2018-06-07 DIAGNOSIS — N750 Cyst of Bartholin's gland: Secondary | ICD-10-CM

## 2018-06-07 DIAGNOSIS — I10 Essential (primary) hypertension: Secondary | ICD-10-CM

## 2018-06-07 LAB — POCT URINALYSIS DIPSTICK
Bilirubin, UA: NEGATIVE
Blood, UA: NEGATIVE
Glucose, UA: NEGATIVE
Ketones, UA: NEGATIVE
Leukocytes, UA: NEGATIVE
Nitrite, UA: NEGATIVE
Protein, UA: NEGATIVE
Spec Grav, UA: 1.015 (ref 1.010–1.025)
Urobilinogen, UA: 0.2 E.U./dL
pH, UA: 5 (ref 5.0–8.0)

## 2018-06-07 MED ORDER — MUPIROCIN 2 % EX OINT: 1.0000 "application " | TOPICAL_OINTMENT | Freq: Two times a day (BID) | CUTANEOUS | 0 refills | Status: AC

## 2018-06-13 ENCOUNTER — Encounter: Payer: Self-pay | Admitting: Nurse Practitioner

## 2018-12-07 ENCOUNTER — Ambulatory Visit: Payer: Managed Care, Other (non HMO) | Admitting: Nurse Practitioner

## 2018-12-07 ENCOUNTER — Other Ambulatory Visit: Payer: Self-pay

## 2018-12-07 ENCOUNTER — Encounter: Payer: Self-pay | Admitting: Nurse Practitioner

## 2018-12-07 VITALS — BP 122/65 | HR 79 | Temp 98.6°F | Resp 16 | Ht 60.0 in | Wt 162.0 lb

## 2018-12-07 DIAGNOSIS — T783XXA Angioneurotic edema, initial encounter: Secondary | ICD-10-CM

## 2018-12-07 DIAGNOSIS — I10 Essential (primary) hypertension: Secondary | ICD-10-CM

## 2018-12-07 DIAGNOSIS — Z91018 Allergy to other foods: Secondary | ICD-10-CM | POA: Diagnosis not present

## 2018-12-07 MED ORDER — HYDROCHLOROTHIAZIDE 12.5 MG PO TABS: 12.5000 mg | ORAL_TABLET | Freq: Every day | ORAL | 5 refills | Status: DC

## 2018-12-07 MED ORDER — WRIST BLOOD PRESSURE MONITOR MISC: 1.0000 | Freq: Every day | 0 refills | Status: DC

## 2018-12-07 NOTE — Progress Notes (Signed)
Subjective:    Patient ID: Courtney Mccarty, female    DOB: [birth year], 58 y.o.   MRN: [id number]  Courtney Mccarty is a 58 y.o. female presenting on 12/07/2018 for Follow-up (hypertension. Patient reports good tolerance and symptom control with medication. patient denies any chest pain, shortness of breath. Patient reports some swelling around ankiles. )   HPI Hypertension - She is not checking BP at home or outside of clinic.    - Current medications: hydrochlorothiazide 12.5 mg once daily, tolerating well without side effects - She is not currently symptomatic.  Patient has resolved sock line edema by morning.  Walks/stand a lot at work.  Works nights in a prison walking/rounding. - Pt denies headache, lightheadedness, dizziness, changes in vision, chest tightness/pressure, palpitations, sudden loss of speech or loss of consciousness. - She  reports no regular exercise routine. - Her diet is moderate in salt, moderate in fat, and moderate in carbohydrates.  Patient eats restaurant meals 3 times per week.  Eats only once per day usually. - Snacks when at work with fruit.   Food Allergy Patient reports frequent food allergy.  Requests to discuss due to angioedema history.  Patient states herbs/spices cause this.  Patient has not had consistent tracking of symptoms, so exact food is difficult to pinpoint.  Suspect preservative based on patient's discussions.  Patient states lowry's season salt and pepper are her typical flavorings.  Avoids citrus as well.  Restaurant meals out are also sometimes troublesome.  Patient does not take anything to help symptoms.  Would like to know causative agent.  Social History   Tobacco Use  . Smoking status: Never Smoker  . Smokeless tobacco: Never Used  Substance Use Topics  . Alcohol use: Yes    Alcohol/week: 20.0 standard drinks    Types: 20 Cans of beer per week  . Drug use: No    Review of Systems Per HPI unless specifically indicated above      Objective:    BP 122/65 (BP Location: Right Arm, Patient Position: Sitting, Cuff Size: Normal)   Pulse 79   Temp 98.6 F (37 C) (Oral)   Resp 16   Ht 5' (1.524 m)   Wt 162 lb (73.5 kg)   LMP 04/05/2015 (Approximate)   SpO2 100%   BMI 31.64 kg/m   Wt Readings from Last 3 Encounters:  12/07/18 162 lb (73.5 kg)  06/06/18 164 lb 12.8 oz (74.8 kg)  05/01/18 162 lb 6.4 oz (73.7 kg)    Physical Exam Vitals signs reviewed.  Constitutional:      General: She is awake. She is not in acute distress.    Appearance: She is well-developed.  HENT:     Head: Normocephalic and atraumatic.  Neck:     Musculoskeletal: Normal range of motion and neck supple.     Vascular: No carotid bruit.  Cardiovascular:     Rate and Rhythm: Normal rate and regular rhythm.     Pulses:          Radial pulses are 2+ on the right side and 2+ on the left side.       Posterior tibial pulses are 1+ on the right side and 1+ on the left side.     Heart sounds: Normal heart sounds, S1 normal and S2 normal.  Pulmonary:     Effort: Pulmonary effort is normal. No respiratory distress.     Breath sounds: Normal breath sounds and air entry.  Skin:    General:  Skin is warm and dry.     Capillary Refill: Capillary refill takes less than 2 seconds.  Neurological:     Mental Status: She is alert and oriented to person, place, and time. Mental status is at baseline.  Psychiatric:        Attention and Perception: Attention normal.        Mood and Affect: Mood and affect normal.        Behavior: Behavior normal. Behavior is cooperative.        Thought Content: Thought content normal.    Results for orders placed or performed in visit on 06/06/18  POCT Urinalysis Dipstick  Result Value Ref Range   Color, UA yellow    Clarity, UA clear    Glucose, UA Negative Negative   Bilirubin, UA Negative    Ketones, UA Negative    Spec Grav, UA 1.015 1.010 - 1.025   Blood, UA Negative    pH, UA 5.0 5.0 - 8.0   Protein, UA  Negative Negative   Urobilinogen, UA 0.2 0.2 or 1.0 E.U./dL   Nitrite, UA Negative    Leukocytes, UA Negative Negative   Appearance Clear    Odor        Assessment & Plan:   Problem List Items Addressed This Visit      Cardiovascular and Mediastinum   Essential hypertension - Primary Controlled hypertension.  BP goal < 130/80.  Pt is not currently working on lifestyle modifications.  Taking medications tolerating well without side effects. No currently identified complications.  Plan: 1. Continue taking HCTZ 12.5 mg once daily 2. Obtain labs today - recheck kidney/electrolytes with thiazide diuretic  3. Encouraged heart healthy diet and increasing exercise to 30 minutes most days of the week. 4. Check BP 1-2 x per week at home, keep log, and bring to clinic at next appointment. 5. Follow up 6 months.     Relevant Medications   Blood Pressure Monitoring (WRIST BLOOD PRESSURE MONITOR) MISC   hydrochlorothiazide (HYDRODIURIL) 12.5 MG tablet   Other Relevant Orders   Comprehensive Metabolic Panel (CMET)    Other Visit Diagnoses    Angioedema of lips, initial encounter       Relevant Orders   Ambulatory referral to Allergy   Food allergy     Unknown cause of angioedema, but patient suspects food allergy.  No prior workup.  Workup needed/desired.  Plan: 1. Encouraged patient to keep log of when allergy occurs and what was eaten to track trigger/patterns 2. Referral to Allergy near patient's weekday home in [place], [place]. 3. Encouraged patient to take antihistamine until evaluation.  Take benadryl during acute allergy event.  Reviewed when to seek emergency care. 4. FOLLOW-UP prn   Relevant Orders   Ambulatory referral to Allergy      Meds ordered this encounter  Medications  . Blood Pressure Monitoring (WRIST BLOOD PRESSURE MONITOR) MISC    Sig: 1 Device by Does not apply route daily. Use daily to check blood pressure    Dispense:  1 each    Refill:  0    Order Specific  Question:   Supervising Provider    Answer:   Olin Hauser [[id number]]  . hydrochlorothiazide (HYDRODIURIL) 12.5 MG tablet    Sig: Take 1 tablet (12.5 mg total) by mouth daily.    Dispense:  30 tablet    Refill:  5    Order Specific Question:   Supervising Provider    Answer:   Nobie Putnam  J [[id number]]   Follow up plan: Return in about 6 months (around 06/06/2019) for hypertension AND annual physical January 2020.  Cassell Smiles, DNP, AGPCNP-BC Adult Gerontology Primary Care Nurse Practitioner [facility] 12/07/2018, 9:23 AM

## 2018-12-07 NOTE — Patient Instructions (Addendum)
Courtney Mccarty,   Thank you for coming in to clinic today.  1. Continue hydrochlorothiazide 12.5 mg once daily.  2. We are working on getting an allergy specialist referral for you in [place], [place] to find out what you are having reactions to.  3. Labs today at [place], [facility] [address]  Please schedule a follow-up appointment with Cassell Smiles, AGNP. Return in about 6 months (around 06/06/2019) for hypertension AND annual physical January 2020.  If you have any other questions or concerns, please feel free to call the clinic or send a message through [place]. You may also schedule an earlier appointment if necessary.  You will receive a survey after today's visit either digitally by e-mail or paper by [employer]. Your experiences and feedback matter to [place].  Please respond so we know how we are doing as we provide care for you.   Cassell Smiles, DNP, AGNP-BC Adult Gerontology Nurse Practitioner [place]

## 2018-12-08 LAB — COMPREHENSIVE METABOLIC PANEL
ALT: 37 IU/L — ABNORMAL HIGH (ref 0–32)
AST: 26 IU/L (ref 0–40)
Albumin/Globulin Ratio: 1.4 (ref 1.2–2.2)
Albumin: 4.6 g/dL (ref 3.8–4.9)
Alkaline Phosphatase: 85 IU/L (ref 39–117)
BUN/Creatinine Ratio: 15 (ref 9–23)
BUN: 12 mg/dL (ref 6–24)
Bilirubin Total: 0.3 mg/dL (ref 0.0–1.2)
CO2: 25 mmol/L (ref 20–29)
Calcium: 10.2 mg/dL (ref 8.7–10.2)
Chloride: 100 mmol/L (ref 96–106)
Creatinine, Ser: 0.8 mg/dL (ref 0.57–1.00)
GFR calc Af Amer: 95 mL/min/{1.73_m2} (ref >59)
GFR calc non Af Amer: 82 mL/min/{1.73_m2} (ref >59)
Globulin, Total: 3.2 g/dL (ref 1.5–4.5)
Glucose: 137 mg/dL — ABNORMAL HIGH (ref 65–99)
Potassium: 4 mmol/L (ref 3.5–5.2)
Sodium: 140 mmol/L (ref 134–144)
Total Protein: 7.8 g/dL (ref 6.0–8.5)

## 2018-12-10 ENCOUNTER — Encounter: Payer: Self-pay | Admitting: Nurse Practitioner

## 2018-12-11 ENCOUNTER — Telehealth: Payer: Self-pay | Admitting: Nurse Practitioner

## 2018-12-11 NOTE — Telephone Encounter (Signed)
-----   Message from Mikey College, NP sent at 12/08/2018  8:50 AM EDT ----- CMP shows elevated glucose - possibly nonfasting.  Please verify with patient when results are called.  May need A1c for DM screening if fasting.  Would need recollect. - Normal electrolytes, kidney function, and liver function.  Patient's medication is safe to continue.

## 2019-05-15 ENCOUNTER — Other Ambulatory Visit: Payer: Self-pay

## 2019-05-15 ENCOUNTER — Ambulatory Visit (INDEPENDENT_AMBULATORY_CARE_PROVIDER_SITE_OTHER): Payer: BLUE CROSS/BLUE SHIELD | Admitting: Family Medicine

## 2019-05-15 ENCOUNTER — Encounter: Payer: Self-pay | Admitting: Family Medicine

## 2019-05-15 VITALS — BP 118/81 | HR 89 | Temp 97.8°F | Ht 60.0 in | Wt 167.0 lb

## 2019-05-15 DIAGNOSIS — I10 Essential (primary) hypertension: Secondary | ICD-10-CM

## 2019-05-15 DIAGNOSIS — R102 Pelvic and perineal pain: Secondary | ICD-10-CM

## 2019-05-15 DIAGNOSIS — Z0000 Encounter for general adult medical examination without abnormal findings: Secondary | ICD-10-CM

## 2019-05-15 MED ORDER — HYDROCHLOROTHIAZIDE 12.5 MG PO TABS: 12.5000 mg | ORAL_TABLET | Freq: Every day | ORAL | 1 refills | Status: DC

## 2019-05-15 NOTE — Patient Instructions (Addendum)
Thank you for coming to the office today.  Stay tuned for apt - placed referral. Stay tuned they can do the ultrasound for you  [facility]: Obstetrics and Gynecology Address: [address] [place], [place] [address] Phone: [phone]  Lab draw today, stay tuned for results.  Refilled Hydrochlorothiazide 12.5mg  once daily, refilled 90 pills to local pharmacy.   Please schedule a Follow-up Appointment to: Return in about 1 year (around 05/14/2020) for Annual Physical.  If you have any other questions or concerns, please feel free to call the office or send a message through [place]. You may also schedule an earlier appointment if necessary.  Additionally, you may be receiving a survey about your experience at our office within a few days to 1 week by e-mail or mail. We value your feedback.  Nobie Putnam, DO [facility]   Preventing Hypertension Hypertension, commonly called high blood pressure, is when the force of blood pumping through the arteries is too strong. Arteries are blood vessels that carry blood from the heart throughout the body. Over time, hypertension can damage the arteries and decrease blood flow to important parts of the body, including the brain, heart, and kidneys. Often, hypertension does not cause symptoms until blood pressure is very high. For this reason, it is important to have your blood pressure checked on a regular basis. Hypertension can often be prevented with diet and lifestyle changes. If you already have hypertension, you can control it with diet and lifestyle changes, as well as medicine. What nutrition changes can be made? Maintain a healthy diet. This includes:  Eating less salt (sodium). Ask your health care provider how much sodium is safe for you to have. The general recommendation is to consume less than 1 tsp (2,300 mg) of sodium a day. ? Do not add salt to your food. ? Choose low-sodium options when grocery  shopping and eating out.  Limiting fats in your diet. You can do this by eating low-fat or fat-free dairy products and by eating less red meat.  Eating more fruits, vegetables, and whole grains. Make a goal to eat: ? 1-2 cups of fresh fruits and vegetables each day. ? 3-4 servings of whole grains each day.  Avoiding foods and beverages that have added sugars.  Eating fish that contain healthy fats (omega-3 fatty acids), such as mackerel or salmon. If you need help putting together a healthy eating plan, try the DASH diet. This diet is high in fruits, vegetables, and whole grains. It is low in sodium, red meat, and added sugars. DASH stands for Dietary Approaches to Stop Hypertension. What lifestyle changes can be made?   Lose weight if you are overweight. Losing just 3?5% of your body weight can help prevent or control hypertension. ? For example, if your present weight is 200 lb (91 kg), a loss of 3-5% of your weight means losing 6-10 lb (2.7-4.5 kg). ? Ask your health care provider to help you with a diet and exercise plan to safely lose weight.  Get enough exercise. Do at least 150 minutes of moderate-intensity exercise each week. ? You could do this in short exercise sessions several times a day, or you could do longer exercise sessions a few times a week. For example, you could take a brisk 10-minute walk or bike ride, 3 times a day, for 5 days a week.  Find ways to reduce stress, such as exercising, meditating, listening to music, or taking a yoga class. If  you need help reducing stress, ask your health care provider.  Do not smoke. This includes e-cigarettes. Chemicals in tobacco and nicotine products raise your blood pressure each time you smoke. If you need help quitting, ask your health care provider.  Avoid alcohol. If you drink alcohol, limit alcohol intake to no more than 1 drink a day for nonpregnant women and 2 drinks a day for men. One drink equals 12 oz of beer, 5 oz of  wine, or 1 oz of hard liquor. Why are these changes important? Diet and lifestyle changes can help you prevent hypertension, and they may make you feel better overall and improve your quality of life. If you have hypertension, making these changes will help you control it and help prevent major complications, such as:  Hardening and narrowing of arteries that supply blood to: ? Your heart. This can cause a heart attack. ? Your brain. This can cause a stroke. ? Your kidneys. This can cause kidney failure.  Stress on your heart muscle, which can cause heart failure. What can I do to lower my risk?  Work with your health care provider to make a hypertension prevention plan that works for you. Follow your plan and keep all follow-up visits as told by your health care provider.  Learn how to check your blood pressure at home. Make sure that you know your personal target blood pressure, as told by your health care provider. How is this treated? In addition to diet and lifestyle changes, your health care provider may recommend medicines to help lower your blood pressure. You may need to try a few different medicines to find what works best for you. You also may need to take more than one medicine. Take over-the-counter and prescription medicines only as told by your health care provider. Where to find support Your health care provider can help you prevent hypertension and help you keep your blood pressure at a healthy level. Your local hospital or your community may also provide support services and prevention programs. The American Heart Association offers an online support network at: CheapBootlegs.com.cy Where to find more information Learn more about hypertension from:  [place], Lung, and Blood Institute: ElectronicHangman.is  Centers for Disease Control and Prevention: https://ingram.com/  American Academy of Family  Physicians: http://familydoctor.org/familydoctor/en/diseases-conditions/high-blood-pressure.printerview.all.html Learn more about the DASH diet from:  [place], Lung, and [place]: https://www.reyes.com/ Contact a health care provider if:  You think you are having a reaction to medicines you have taken.  You have recurrent headaches or feel dizzy.  You have swelling in your ankles.  You have trouble with your vision. Summary  Hypertension often does not cause any symptoms until blood pressure is very high. It is important to get your blood pressure checked regularly.  Diet and lifestyle changes are the most important steps in preventing hypertension.  By keeping your blood pressure in a healthy range, you can prevent complications like heart attack, heart failure, stroke, and kidney failure.  Work with your health care provider to make a hypertension prevention plan that works for you. This information is not intended to replace advice given to you by your health care provider. Make sure you discuss any questions you have with your health care provider. Document Revised: 07/13/2018 Document Reviewed: 11/30/2015 Elsevier Patient Education  2020 [employer].

## 2019-05-15 NOTE — Progress Notes (Signed)
Subjective:    Patient ID: Courtney Mccarty, female    DOB: [birth year], 59 y.o.   MRN: [id number]  Courtney Mccarty is a 59 y.o. female presenting on 05/15/2019 for Annual Exam   HPI   Here for Annual Physical and due for fasting lab today.  CHRONIC HTN / Obesity BMI >32 Reports no concerns with BP lately. Current Meds - HCTZ 12.5mg  daily, due for refill   Reports good compliance, took meds today. Tolerating well, w/o complaints. Denies CP, dyspnea, HA, edema, dizziness / lightheadedness  LEFT Pelvic Pain / History of genetic predisposition for Uterine Cancer She reports previously in [place] has tested positive for gene that shows increase risk of uterine cancer, and she was followed every 6 months with ultrasound for a period of time, now wishes to resume surveillance. Previously was tested in the mid 1990s.   Health Maintenance: Declined Mammogram screening despite counseling on benefits UTD Pap, colonoscopy, Tdap, Hep C screening  Depression screen [facility] 2/9 05/15/2019 05/01/2018 05/01/2018  Decreased Interest 0 0 0  Down, Depressed, Hopeless 0 0 0  PHQ - 2 Score 0 0 0  Altered sleeping - 0 -  Tired, decreased energy - 0 -  Change in appetite - 0 -  Feeling bad or failure about yourself  - 0 -  Trouble concentrating - 0 -  Moving slowly or fidgety/restless - 0 -  Suicidal thoughts - 0 -  PHQ-9 Score - 0 -    Past Medical History:  Diagnosis Date  . Essential hypertension 10/11/2016   Past Surgical History:  Procedure Laterality Date  . COLONOSCOPY WITH PROPOFOL N/A 11/28/2016   Procedure: COLONOSCOPY WITH PROPOFOL;  Surgeon: Lucilla Lame, MD;  Location: [facility];  Service: Gastroenterology;  Laterality: N/A;  . NO PAST SURGERIES    . POLYPECTOMY N/A 11/28/2016   Procedure: POLYPECTOMY;  Surgeon: Lucilla Lame, MD;  Location: [facility];  Service: Gastroenterology;  Laterality: N/A;   Social History   Socioeconomic History  . Marital status: Single    Spouse  name: Not on file  . Number of children: Not on file  . Years of education: Not on file  . Highest education level: Not on file  Occupational History  . Not on file  Tobacco Use  . Smoking status: Never Smoker  . Smokeless tobacco: Never Used  Substance and Sexual Activity  . Alcohol use: Yes    Alcohol/week: 20.0 standard drinks    Types: 20 Cans of beer per week  . Drug use: No  . Sexual activity: Yes    Birth control/protection: None  Other Topics Concern  . Not on file  Social History Narrative  . Not on file   Social Determinants of Health   Financial Resource Strain:   . Difficulty of Paying Living Expenses: Not on file  Food Insecurity:   . Worried About Charity fundraiser in the Last Year: Not on file  . Ran Out of Food in the Last Year: Not on file  Transportation Needs:   . Lack of Transportation (Medical): Not on file  . Lack of Transportation (Non-Medical): Not on file  Physical Activity:   . Days of Exercise per Week: Not on file  . Minutes of Exercise per Session: Not on file  Stress:   . Feeling of Stress : Not on file  Social Connections:   . Frequency of Communication with Friends and Family: Not on file  . Frequency of Social Gatherings with  Friends and Family: Not on file  . Attends Religious Services: Not on file  . Active Member of Clubs or Organizations: Not on file  . Attends Archivist Meetings: Not on file  . Marital Status: Not on file  Intimate Partner Violence:   . Fear of Current or Ex-Partner: Not on file  . Emotionally Abused: Not on file  . Physically Abused: Not on file  . Sexually Abused: Not on file   Family History  Problem Relation Age of Onset  . Diabetes Mother   . Hypertension Mother   . Heart disease Mother   . Depression Mother   . Diabetes Father   . Hypertension Father   . Diabetes Maternal Grandmother   . Cancer Maternal Grandfather   . Diabetes Paternal Grandmother   . Cancer Paternal Grandfather     Current Outpatient Medications on File Prior to Visit  Medication Sig  . Blood Pressure Monitoring (WRIST BLOOD PRESSURE MONITOR) MISC 1 Device by Does not apply route daily. Use daily to check blood pressure  . Ginkgo Biloba 40 MG TABS Take by mouth.   No current facility-administered medications on file prior to visit.    Review of Systems  Constitutional: Negative for activity change, appetite change, chills, diaphoresis, fatigue and fever.  HENT: Negative for congestion and hearing loss.   Eyes: Negative for visual disturbance.  Respiratory: Negative for cough, chest tightness, shortness of breath and wheezing.   Cardiovascular: Negative for chest pain, palpitations and leg swelling.  Gastrointestinal: Negative for abdominal pain, constipation, diarrhea, nausea and vomiting.  Endocrine: Negative for cold intolerance.  Genitourinary: Positive for pelvic pain (left). Negative for dysuria, frequency and hematuria.  Musculoskeletal: Negative for arthralgias and neck pain.  Skin: Negative for rash.  Allergic/Immunologic: Negative for environmental allergies.  Neurological: Negative for dizziness, weakness, light-headedness, numbness and headaches.  Hematological: Negative for adenopathy.  Psychiatric/Behavioral: Negative for behavioral problems, dysphoric mood and sleep disturbance.   Per HPI unless specifically indicated above      Objective:    BP 118/81 (BP Location: Left Arm, Patient Position: Sitting, Cuff Size: Normal)   Pulse 89   Temp 97.8 F (36.6 C) (Oral)   Ht 5' (1.524 m)   Wt 167 lb (75.8 kg)   LMP 04/05/2015 (Approximate)   BMI 32.61 kg/m   Wt Readings from Last 3 Encounters:  05/15/19 167 lb (75.8 kg)  12/07/18 162 lb (73.5 kg)  06/06/18 164 lb 12.8 oz (74.8 kg)    Physical Exam Vitals and nursing note reviewed.  Constitutional:      General: She is not in acute distress.    Appearance: She is well-developed. She is not diaphoretic.     Comments:  Well-appearing, comfortable, cooperative  HENT:     Head: Normocephalic and atraumatic.  Eyes:     General:        Right eye: No discharge.        Left eye: No discharge.     Conjunctiva/sclera: Conjunctivae normal.     Pupils: Pupils are equal, round, and reactive to light.  Neck:     Thyroid: No thyromegaly.  Cardiovascular:     Rate and Rhythm: Normal rate and regular rhythm.     Heart sounds: Normal heart sounds. No murmur.  Pulmonary:     Effort: Pulmonary effort is normal. No respiratory distress.     Breath sounds: Normal breath sounds. No wheezing or rales.  Abdominal:     General: Bowel sounds are  normal. There is no distension.     Palpations: Abdomen is soft. There is no mass.     Tenderness: There is no abdominal tenderness.  Musculoskeletal:        General: No tenderness. Normal range of motion.     Cervical back: Normal range of motion and neck supple.     Comments: Upper / Lower Extremities: - Normal muscle tone, strength bilateral upper extremities 5/5, lower extremities 5/5  Lymphadenopathy:     Cervical: No cervical adenopathy.  Skin:    General: Skin is warm and dry.     Findings: No erythema or rash.  Neurological:     Mental Status: She is alert and oriented to person, place, and time.     Comments: Distal sensation intact to light touch all extremities  Psychiatric:        Behavior: Behavior normal.     Comments: Well groomed, good eye contact, normal speech and thoughts       Results for orders placed or performed in visit on 12/07/18  Comprehensive Metabolic Panel (CMET)  Result Value Ref Range   Glucose 137 (H) 65 - 99 mg/dL   BUN 12 6 - 24 mg/dL   Creatinine, Ser 0.80 0.57 - 1.00 mg/dL   GFR calc non Af Amer 82 >59 mL/min/1.73   GFR calc Af Amer 95 >59 mL/min/1.73   BUN/Creatinine Ratio 15 9 - 23   Sodium 140 134 - 144 mmol/L   Potassium 4.0 3.5 - 5.2 mmol/L   Chloride 100 96 - 106 mmol/L   CO2 25 20 - 29 mmol/L   Calcium 10.2 8.7 - 10.2  mg/dL   Total Protein 7.8 6.0 - 8.5 g/dL   Albumin 4.6 3.8 - 4.9 g/dL   Globulin, Total 3.2 1.5 - 4.5 g/dL   Albumin/Globulin Ratio 1.4 1.2 - 2.2   Bilirubin Total 0.3 0.0 - 1.2 mg/dL   Alkaline Phosphatase 85 39 - 117 IU/L   AST 26 0 - 40 IU/L   ALT 37 (H) 0 - 32 IU/L      Assessment & Plan:   Problem List Items Addressed This Visit    Essential hypertension   Relevant Medications   hydrochlorothiazide (HYDRODIURIL) 12.5 MG tablet    Other Visit Diagnoses    Annual physical exam    -  Primary   Pelvic pain in female       Relevant Orders   Ambulatory referral to Obstetrics / Gynecology      Updated Health Maintenance information Will get fasting labs today, f/u results. Encouraged improvement to lifestyle with diet and exercise - Goal of weight loss  #HTN Controlled Refill HCTZ12.5mg   #Pelvic Pain, L sided Prior history of concern for higher risk uterine cancer by her report, do not see prior records regarding this issue previous dx in [place]. - She requests re-establish with GYN for pelvic ultrasound follow-up - Requested [place] - refer to [place] ordered this encounter  Medications  . hydrochlorothiazide (HYDRODIURIL) 12.5 MG tablet    Sig: Take 1 tablet (12.5 mg total) by mouth daily.    Dispense:  90 tablet    Refill:  1     Follow up plan: Return in about 1 year (around 05/14/2020) for Annual Physical.  Nobie Putnam, DO [facility] 05/15/2019, 8:26 AM

## 2019-05-16 LAB — CBC WITH DIFFERENTIAL/PLATELET
Absolute Monocytes: 460 cells/uL (ref 200–950)
Basophils Absolute: 29 cells/uL (ref 0–200)
Basophils Relative: 0.4 %
Eosinophils Absolute: 197 cells/uL (ref 15–500)
Eosinophils Relative: 2.7 %
HCT: 39.7 % (ref 35.0–45.0)
Hemoglobin: 13.4 g/dL (ref 11.7–15.5)
Lymphs Abs: 2300 cells/uL (ref 850–3900)
MCH: 28.2 pg (ref 27.0–33.0)
MCHC: 33.8 g/dL (ref 32.0–36.0)
MCV: 83.6 fL (ref 80.0–100.0)
MPV: 11.2 fL (ref 7.5–12.5)
Monocytes Relative: 6.3 %
Neutro Abs: 4314 cells/uL (ref 1500–7800)
Neutrophils Relative %: 59.1 %
Platelets: 266 10*3/uL (ref 140–400)
RBC: 4.75 10*6/uL (ref 3.80–5.10)
RDW: 13.3 % (ref 11.0–15.0)
Total Lymphocyte: 31.5 %
WBC: 7.3 10*3/uL (ref 3.8–10.8)

## 2019-05-16 LAB — COMPREHENSIVE METABOLIC PANEL
AG Ratio: 1.4 (calc) (ref 1.0–2.5)
ALT: 40 U/L — ABNORMAL HIGH (ref 6–29)
AST: 27 U/L (ref 10–35)
Albumin: 4.8 g/dL (ref 3.6–5.1)
Alkaline phosphatase (APISO): 82 U/L (ref 37–153)
BUN: 17 mg/dL (ref 7–25)
CO2: 24 mmol/L (ref 20–32)
Calcium: 10.5 mg/dL — ABNORMAL HIGH (ref 8.6–10.4)
Chloride: 101 mmol/L (ref 98–110)
Creat: 0.75 mg/dL (ref 0.50–1.05)
Globulin: 3.5 g/dL (calc) (ref 1.9–3.7)
Glucose, Bld: 121 mg/dL — ABNORMAL HIGH (ref 65–99)
Potassium: 4 mmol/L (ref 3.5–5.3)
Sodium: 137 mmol/L (ref 135–146)
Total Bilirubin: 0.3 mg/dL (ref 0.2–1.2)
Total Protein: 8.3 g/dL — ABNORMAL HIGH (ref 6.1–8.1)

## 2019-05-16 LAB — HEMOGLOBIN A1C
Hgb A1c MFr Bld: 6.3 % of total Hgb — ABNORMAL HIGH (ref <5.7)
Mean Plasma Glucose: 134 (calc)
eAG (mmol/L): 7.4 (calc)

## 2019-05-16 LAB — LIPID PANEL
Cholesterol: 166 mg/dL (ref <200)
HDL: 44 mg/dL — ABNORMAL LOW (ref >=50)
Non-HDL Cholesterol (Calc): 122 mg/dL (calc) (ref <130)
Total CHOL/HDL Ratio: 3.8 (calc) (ref <5.0)
Triglycerides: 451 mg/dL — ABNORMAL HIGH (ref <150)

## 2019-05-16 LAB — TSH: TSH: 3.81 mIU/L (ref 0.40–4.50)

## 2019-05-21 ENCOUNTER — Encounter: Payer: Self-pay | Admitting: Family Medicine

## 2019-05-21 DIAGNOSIS — R7303 Prediabetes: Secondary | ICD-10-CM | POA: Insufficient documentation

## 2019-05-28 ENCOUNTER — Telehealth: Payer: Self-pay

## 2019-05-28 NOTE — Telephone Encounter (Signed)
The pt was notified of her lab results and she verbalize understanding, no questions or concerns.

## 2019-09-16 ENCOUNTER — Other Ambulatory Visit: Payer: Self-pay

## 2019-09-16 ENCOUNTER — Ambulatory Visit (INDEPENDENT_AMBULATORY_CARE_PROVIDER_SITE_OTHER): Payer: BLUE CROSS/BLUE SHIELD | Admitting: Family Medicine

## 2019-09-16 ENCOUNTER — Encounter: Payer: Self-pay | Admitting: Family Medicine

## 2019-09-16 VITALS — BP 125/64 | HR 93 | Temp 97.1°F | Ht 60.0 in | Wt 162.8 lb

## 2019-09-16 DIAGNOSIS — Z021 Encounter for pre-employment examination: Secondary | ICD-10-CM | POA: Diagnosis not present

## 2019-09-16 DIAGNOSIS — Z1231 Encounter for screening mammogram for malignant neoplasm of breast: Secondary | ICD-10-CM

## 2019-09-16 DIAGNOSIS — Z13820 Encounter for screening for osteoporosis: Secondary | ICD-10-CM

## 2019-09-16 NOTE — Progress Notes (Signed)
Subjective:    Patient ID: Courtney Mccarty, female    DOB: [birth year], 59 y.o.   MRN: [id number]  Emira Eubanks is a 59 y.o. female presenting on 09/16/2019 for Employment Physical   HPI  HEALTH MAINTENANCE:  Weight/BMI: Obese, BMI 31.79 Physical activity: No structured routine Diet: Regular Seatbelt: Yes Sunscreen: As needed Mammogram: DUE, order written PAP: DUE after 11/2019 DEXA: DUE, order written Colon cancer screening: Completed 11/28/2016, DUE 11/28/2021 HIV & Hep C Screening: Completed Optometry: As needed Dentistry: As needed  IMMUNIZATIONS: Influenza: Due next season Tetanus: Up to date, 11/08/2016  Depression screen [facility] 2/9 05/15/2019 05/01/2018 05/01/2018  Decreased Interest 0 0 0  Down, Depressed, Hopeless 0 0 0  PHQ - 2 Score 0 0 0  Altered sleeping - 0 -  Tired, decreased energy - 0 -  Change in appetite - 0 -  Feeling bad or failure about yourself  - 0 -  Trouble concentrating - 0 -  Moving slowly or fidgety/restless - 0 -  Suicidal thoughts - 0 -  PHQ-9 Score - 0 -    Past Medical History:  Diagnosis Date  . Essential hypertension 10/11/2016   Past Surgical History:  Procedure Laterality Date  . COLONOSCOPY WITH PROPOFOL N/A 11/28/2016   Procedure: COLONOSCOPY WITH PROPOFOL;  Surgeon: Lucilla Lame, MD;  Location: [place];  Service: Gastroenterology;  Laterality: N/A;  . NO PAST SURGERIES    . POLYPECTOMY N/A 11/28/2016   Procedure: POLYPECTOMY;  Surgeon: Lucilla Lame, MD;  Location: [place];  Service: Gastroenterology;  Laterality: N/A;   Social History   Socioeconomic History  . Marital status: Single    Spouse name: Not on file  . Number of children: Not on file  . Years of education: Not on file  . Highest education level: Not on file  Occupational History  . Not on file  Tobacco Use  . Smoking status: Never Smoker  . Smokeless tobacco: Never Used  Vaping Use  . Vaping Use: Never used  Substance and Sexual  Activity  . Alcohol use: Yes    Alcohol/week: 20.0 standard drinks    Types: 20 Cans of beer per week  . Drug use: No  . Sexual activity: Yes    Birth control/protection: None  Other Topics Concern  . Not on file  Social History Narrative  . Not on file   Social Determinants of Health   Financial Resource Strain:   . Difficulty of Paying Living Expenses:   Food Insecurity:   . Worried About Charity fundraiser in the Last Year:   . Arboriculturist in the Last Year:   Transportation Needs:   . Film/video editor (Medical):   Marland Kitchen Lack of Transportation (Non-Medical):   Physical Activity:   . Days of Exercise per Week:   . Minutes of Exercise per Session:   Stress:   . Feeling of Stress :   Social Connections:   . Frequency of Communication with Friends and Family:   . Frequency of Social Gatherings with Friends and Family:   . Attends Religious Services:   . Active Member of Clubs or Organizations:   . Attends Archivist Meetings:   Marland Kitchen Marital Status:   Intimate Partner Violence:   . Fear of Current or Ex-Partner:   . Emotionally Abused:   Marland Kitchen Physically Abused:   . Sexually Abused:    Family History  Problem Relation Age of Onset  . Diabetes Mother   .  Hypertension Mother   . Heart disease Mother   . Depression Mother   . Diabetes Father   . Hypertension Father   . Diabetes Maternal Grandmother   . Cancer Maternal Grandfather   . Diabetes Paternal Grandmother   . Cancer Paternal Grandfather    Current Outpatient Medications on File Prior to Visit  Medication Sig  . Ginkgo Biloba 40 MG TABS Take by mouth.  . hydrochlorothiazide (HYDRODIURIL) 12.5 MG tablet Take 1 tablet (12.5 mg total) by mouth daily.   No current facility-administered medications on file prior to visit.    Per HPI unless specifically indicated above     Objective:    BP 125/64 (BP Location: Left Arm, Patient Position: Sitting, Cuff Size: Normal)   Pulse 93   Temp (!) 97.1 F  (36.2 C) (Temporal)   Ht 5' (1.524 m)   Wt 162 lb 12.8 oz (73.8 kg)   LMP 04/05/2015 (Approximate)   BMI 31.79 kg/m   Wt Readings from Last 3 Encounters:  09/16/19 162 lb 12.8 oz (73.8 kg)  05/15/19 167 lb (75.8 kg)  12/07/18 162 lb (73.5 kg)    Physical Exam Vitals reviewed.  Constitutional:      General: She is not in acute distress.    Appearance: Normal appearance. She is well-developed and well-groomed. She is obese. She is not ill-appearing or toxic-appearing.  HENT:     Head: Normocephalic.     Right Ear: Tympanic membrane, ear canal and external ear normal. There is no impacted cerumen.     Left Ear: Tympanic membrane, ear canal and external ear normal. There is no impacted cerumen.     Nose: Nose normal. No congestion or rhinorrhea.     Mouth/Throat:     Lips: Pink.     Mouth: Mucous membranes are moist.     Pharynx: Oropharynx is clear. Uvula midline. No oropharyngeal exudate or posterior oropharyngeal erythema.  Eyes:     General: Lids are normal. Vision grossly intact. No scleral icterus.       Right eye: No discharge.        Left eye: No discharge.     Extraocular Movements: Extraocular movements intact.     Conjunctiva/sclera: Conjunctivae normal.     Pupils: Pupils are equal, round, and reactive to light.  Neck:     Thyroid: No thyroid mass or thyromegaly.  Cardiovascular:     Rate and Rhythm: Normal rate and regular rhythm.     Pulses: Normal pulses.          Dorsalis pedis pulses are 2+ on the right side and 2+ on the left side.     Heart sounds: Normal heart sounds. No murmur heard.  No friction rub. No gallop.   Pulmonary:     Effort: Pulmonary effort is normal. No respiratory distress.     Breath sounds: Normal breath sounds.  Abdominal:     General: Abdomen is flat. Bowel sounds are normal. There is no distension.     Palpations: Abdomen is soft. There is no hepatomegaly, splenomegaly or mass.     Tenderness: There is no abdominal tenderness. There  is no guarding or rebound.     Hernia: No hernia is present.  Musculoskeletal:        General: Normal range of motion.     Cervical back: Normal range of motion and neck supple. No tenderness.     Right lower leg: No edema.     Left lower leg: No edema.  Feet:     Right foot:     Skin integrity: Skin integrity normal.     Left foot:     Skin integrity: Skin integrity normal.  Lymphadenopathy:     Cervical: No cervical adenopathy.  Skin:    General: Skin is warm and dry.     Capillary Refill: Capillary refill takes less than 2 seconds.  Neurological:     General: No focal deficit present.     Mental Status: She is alert and oriented to person, place, and time.     Cranial Nerves: No cranial nerve deficit.     Sensory: No sensory deficit.     Motor: No weakness.     Coordination: Coordination normal.     Gait: Gait normal.     Deep Tendon Reflexes: Reflexes normal.  Psychiatric:        Attention and Perception: Attention and perception normal.        Mood and Affect: Mood and affect normal.        Speech: Speech normal.        Behavior: Behavior normal. Behavior is cooperative.        Thought Content: Thought content normal.        Cognition and Memory: Cognition and memory normal.        Judgment: Judgment normal.     Results for orders placed or performed in visit on 05/15/19  Comprehensive metabolic panel  Result Value Ref Range   Glucose, Bld 121 (H) 65 - 99 mg/dL   BUN 17 7 - 25 mg/dL   Creat 0.75 0.50 - 1.05 mg/dL   BUN/Creatinine Ratio NOT APPLICABLE 6 - 22 (calc)   Sodium 137 135 - 146 mmol/L   Potassium 4.0 3.5 - 5.3 mmol/L   Chloride 101 98 - 110 mmol/L   CO2 24 20 - 32 mmol/L   Calcium 10.5 (H) 8.6 - 10.4 mg/dL   Total Protein 8.3 (H) 6.1 - 8.1 g/dL   Albumin 4.8 3.6 - 5.1 g/dL   Globulin 3.5 1.9 - 3.7 g/dL (calc)   AG Ratio 1.4 1.0 - 2.5 (calc)   Total Bilirubin 0.3 0.2 - 1.2 mg/dL   Alkaline phosphatase (APISO) 82 37 - 153 U/L   AST 27 10 - 35 U/L    ALT 40 (H) 6 - 29 U/L  Hemoglobin A1c  Result Value Ref Range   Hgb A1c MFr Bld 6.3 (H) <5.7 % of total Hgb   Mean Plasma Glucose 134 (calc)   eAG (mmol/L) 7.4 (calc)  TSH  Result Value Ref Range   TSH 3.81 0.40 - 4.50 mIU/L  CBC with Differential/Platelet  Result Value Ref Range   WBC 7.3 3.8 - 10.8 Thousand/uL   RBC 4.75 3.80 - 5.10 Million/uL   Hemoglobin 13.4 11.7 - 15.5 g/dL   HCT 39.7 35 - 45 %   MCV 83.6 80.0 - 100.0 fL   MCH 28.2 27.0 - 33.0 pg   MCHC 33.8 32.0 - 36.0 g/dL   RDW 13.3 11.0 - 15.0 %   Platelets 266 140 - 400 Thousand/uL   MPV 11.2 7.5 - 12.5 fL   Neutro Abs 4,314 1,500 - 7,800 cells/uL   Lymphs Abs 2,300 850 - 3,900 cells/uL   Absolute Monocytes 460 200 - 950 cells/uL   Eosinophils Absolute 197 15 - 500 cells/uL   Basophils Absolute 29 0 - 200 cells/uL   Neutrophils Relative % 59.1 %   Total Lymphocyte 31.5 %  Monocytes Relative 6.3 %   Eosinophils Relative 2.7 %   Basophils Relative 0.4 %  Lipid panel  Result Value Ref Range   Cholesterol 166 <200 mg/dL   HDL 44 (L) > OR = 50 mg/dL   Triglycerides 451 (H) <150 mg/dL   LDL Cholesterol (Calc)  mg/dL (calc)   Total CHOL/HDL Ratio 3.8 <5.0 (calc)   Non-HDL Cholesterol (Calc) 122 <130 mg/dL (calc)      Assessment & Plan:   Problem List Items Addressed This Visit      Other   Physical exam, pre-employment    Work PE for new proposed employment.  Forms completed prior to patient appointment concluded, copied and faxed to proposed employer and provided patient with originals.       Other Visit Diagnoses    Screening for osteoporosis    -  Primary   Relevant Orders   DG Bone Density   Encounter for screening mammogram for malignant neoplasm of breast       Relevant Orders   MM 3D SCREEN BREAST BILATERAL      No orders of the defined types were placed in this encounter.     Follow up plan: Return if symptoms worsen or fail to improve.  Harlin Rain, FNP-C Family Nurse  Practitioner [facility] 09/16/2019, 2:05 PM

## 2019-09-16 NOTE — Patient Instructions (Signed)
For Mammogram screening for breast cancer DEXA Scan (Bone mineral density) screening for osteoporosis  Call the [facility] [place] [facility] below anytime to schedule your own appointment now that order has been placed.  [facility] [place], [place] [address] Phone: [phone]  [facility] [address] [place], [place] [address] Phone: [phone]  We have faxed in your work physical forms to your employer.  We will plan to see you back as needed  You will receive a survey after today's visit either digitally by e-mail or paper by [place] mail. Your experiences and feedback matter to [place].  Please respond so we know how we are doing as we provide care for you.  Call us with any questions/concerns/needs.  It is my goal to be available to you for your health concerns.  Thanks for choosing me to be a partner in your healthcare needs!  Harlin Rain, FNP-C Family Nurse Practitioner [facility] Phone: [phone]

## 2019-09-16 NOTE — Assessment & Plan Note (Signed)
Work PE for new proposed employment.  Forms completed prior to patient appointment concluded, copied and faxed to proposed employer and provided patient with originals.

## 2019-09-17 ENCOUNTER — Encounter: Payer: Self-pay | Admitting: Family Medicine

## 2019-11-27 ENCOUNTER — Telehealth: Payer: Self-pay | Admitting: Family Medicine

## 2019-11-27 ENCOUNTER — Other Ambulatory Visit: Payer: Self-pay | Admitting: Family Medicine

## 2019-11-27 DIAGNOSIS — I10 Essential (primary) hypertension: Secondary | ICD-10-CM

## 2019-11-27 NOTE — Telephone Encounter (Signed)
Copied from [place] [phone]. Topic: Quick Communication - Rx Refill/Question >> Nov 27, 2019  1:47 PM Loma Boston wrote: hydrochlorothiazide (HYDRODIURIL) 12.5 MG tablet Medication Date: 05/15/2019 Department: [facility] Ordering/Authorizing: Olin Hauser, DO  [place], [place]  Phone:  [phone] Fax:  [phone]   CB or pt if issues [phone]

## 2019-11-27 NOTE — Telephone Encounter (Signed)
Refill sent to pharmacy on 11/27/19.

## 2020-03-05 ENCOUNTER — Other Ambulatory Visit: Payer: Self-pay | Admitting: Family Medicine

## 2020-03-05 DIAGNOSIS — I10 Essential (primary) hypertension: Secondary | ICD-10-CM

## 2020-04-22 ENCOUNTER — Encounter: Payer: Self-pay | Admitting: Family Medicine

## 2020-04-22 ENCOUNTER — Ambulatory Visit (INDEPENDENT_AMBULATORY_CARE_PROVIDER_SITE_OTHER): Payer: BC Managed Care – PPO | Admitting: Family Medicine

## 2020-04-22 ENCOUNTER — Other Ambulatory Visit (HOSPITAL_COMMUNITY)
Admission: RE | Admit: 2020-04-22 | Discharge: 2020-04-22 | Disposition: A | Discharge status: Home / Self Care | Payer: Self-pay | Source: Ambulatory Visit | Attending: Family Medicine | Admitting: Family Medicine

## 2020-04-22 ENCOUNTER — Other Ambulatory Visit: Payer: Self-pay

## 2020-04-22 VITALS — BP 136/80 | HR 87 | Temp 98.5°F | Resp 18 | Ht 60.0 in | Wt 160.6 lb

## 2020-04-22 DIAGNOSIS — Z7689 Persons encountering health services in other specified circumstances: Secondary | ICD-10-CM | POA: Diagnosis not present

## 2020-04-22 DIAGNOSIS — Z0000 Encounter for general adult medical examination without abnormal findings: Secondary | ICD-10-CM | POA: Diagnosis not present

## 2020-04-22 DIAGNOSIS — Z13820 Encounter for screening for osteoporosis: Secondary | ICD-10-CM | POA: Insufficient documentation

## 2020-04-22 DIAGNOSIS — R7303 Prediabetes: Secondary | ICD-10-CM | POA: Insufficient documentation

## 2020-04-22 DIAGNOSIS — Z113 Encounter for screening for infections with a predominantly sexual mode of transmission: Secondary | ICD-10-CM | POA: Insufficient documentation

## 2020-04-22 DIAGNOSIS — Z1231 Encounter for screening mammogram for malignant neoplasm of breast: Secondary | ICD-10-CM | POA: Insufficient documentation

## 2020-04-22 NOTE — Assessment & Plan Note (Signed)
Annual physical exam without new findings.  Well adult with no acute concerns.  Plan: 1. Obtain health maintenance screenings as above according to age. - Increase physical activity to 30 minutes most days of the week.  - Eat healthy diet high in vegetables and fruits; low in refined carbohydrates. - Screening labs and tests as ordered 2. Return 1 year for annual physical.  

## 2020-04-22 NOTE — Progress Notes (Signed)
Subjective:    Patient ID: Courtney Mccarty, female    DOB: Sep 02, 1960, 60 y.o.   MRN: [id number]  Courtney Mccarty is a 60 y.o. 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 She was told she needed f/u every six months to follow up on it. She is requesting a referral for a f/u. )   HPI  HEALTH MAINTENANCE:  Weight/BMI: Obese, BMI 31.37% Diet: Regular Seatbelt: Yes Sunscreen: No, has no skin concerns today PAP: Due, referred to OB/GYN Mammogram: Due, ordered DEXA: Due, ordered Colon cancer screening: Completed 11/28/2016, Due 11/28/2021 HIV & Hep C Screening: Offered and declined  GC/CT: Offered and declined  Optometry: Has not been in a while, encouraged Dentistry: Regularly  IMMUNIZATIONS: Tetanus: Up to date, 11/08/2016 Influenza: Declines COVID: Declines  Depression screen [facility] 2/9 05/15/2019 05/01/2018 05/01/2018  Decreased Interest 0 0 0  Down, Depressed, Hopeless 0 0 0  PHQ - 2 Score 0 0 0  Altered sleeping - 0 -  Tired, decreased energy - 0 -  Change in appetite - 0 -  Feeling bad or failure about yourself  - 0 -  Trouble concentrating - 0 -  Moving slowly or fidgety/restless - 0 -  Suicidal thoughts - 0 -  PHQ-9 Score - 0 -    Past Medical History:  Diagnosis Date  . Essential hypertension 10/11/2016   Past Surgical History:  Procedure Laterality Date  . COLONOSCOPY WITH PROPOFOL N/A 11/28/2016   Procedure: COLONOSCOPY WITH PROPOFOL;  Surgeon: Lucilla Lame, MD;  Location: [place];  Service: Gastroenterology;  Laterality: N/A;  . NO PAST SURGERIES    . POLYPECTOMY N/A 11/28/2016   Procedure: POLYPECTOMY;  Surgeon: Lucilla Lame, MD;  Location: [place];  Service: Gastroenterology;  Laterality: N/A;   Social History   Socioeconomic History  . Marital status: Single    Spouse name: Not on file  . Number of children: Not on file  . Years of education: Not on  file  . Highest education level: Not on file  Occupational History  . Not on file  Tobacco Use  . Smoking status: Never Smoker  . Smokeless tobacco: Never Used  Vaping Use  . Vaping Use: Never used  Substance and Sexual Activity  . Alcohol use: Yes    Alcohol/week: 20.0 standard drinks    Types: 20 Cans of beer per week  . Drug use: No  . Sexual activity: Yes    Birth control/protection: None  Other Topics Concern  . Not on file  Social History Narrative  . Not on file   Social Determinants of Health   Financial Resource Strain: Not on file  Food Insecurity: Not on file  Transportation Needs: Not on file  Physical Activity: Not on file  Stress: Not on file  Social Connections: Not on file  Intimate Partner Violence: Not on file   Family History  Problem Relation Age of Onset  . Diabetes Mother   . Hypertension Mother   . Heart disease Mother   . Depression Mother   . Diabetes Father   . Hypertension Father   . Diabetes Maternal Grandmother   . Cancer Maternal Grandfather   . Diabetes Paternal Grandmother   . Cancer Paternal Grandfather    Current Outpatient Medications on File Prior to Visit  Medication Sig  . cholecalciferol (VITAMIN D3) 25 MCG (1000 UNIT) tablet Take 1,000 Units by  mouth daily.  . hydrochlorothiazide (HYDRODIURIL) 12.5 MG tablet Take 1 tablet by mouth once daily  . Omega-3 Fatty Acids (FISH OIL) 1000 MG CAPS Take by mouth.  . Ginkgo Biloba 40 MG TABS Take by mouth. (Patient not taking: Reported on 04/22/2020)   No current facility-administered medications on file prior to visit.    Per HPI unless specifically indicated above     Objective:    BP 136/80 (BP Location: Right Arm, Patient Position: Sitting, Cuff Size: Normal)   Pulse 87   Temp 98.5 F (36.9 C) (Oral)   Resp 18   Ht 5' (1.524 m)   Wt 160 lb 9.6 oz (72.8 kg)   LMP 04/05/2015 (Approximate)   SpO2 100%   BMI 31.37 kg/m   Wt Readings from Last 3 Encounters:  04/22/20 160  lb 9.6 oz (72.8 kg)  09/16/19 162 lb 12.8 oz (73.8 kg)  05/15/19 167 lb (75.8 kg)    Physical Exam Vitals and nursing note reviewed.  Constitutional:      General: She is not in acute distress.    Appearance: Normal appearance. She is well-developed and well-groomed. She is obese. She is not ill-appearing or toxic-appearing.  HENT:     Head: Normocephalic and atraumatic.     Right Ear: Tympanic membrane, ear canal and external ear normal. There is no impacted cerumen.     Left Ear: Tympanic membrane, ear canal and external ear normal. There is no impacted cerumen.     Nose: Nose normal. No congestion or rhinorrhea.     Mouth/Throat:     Lips: Pink.     Mouth: Mucous membranes are moist.     Pharynx: Oropharynx is clear. Uvula midline. No oropharyngeal exudate or posterior oropharyngeal erythema.  Eyes:     General: Lids are normal. Vision grossly intact. No scleral icterus.       Right eye: No discharge.        Left eye: No discharge.     Extraocular Movements: Extraocular movements intact.     Conjunctiva/sclera: Conjunctivae normal.     Pupils: Pupils are equal, round, and reactive to light.  Neck:     Thyroid: No thyroid mass or thyromegaly.  Cardiovascular:     Rate and Rhythm: Normal rate and regular rhythm.     Pulses: Normal pulses.          Dorsalis pedis pulses are 2+ on the right side and 2+ on the left side.     Heart sounds: Normal heart sounds. No murmur heard. No friction rub. No gallop.   Pulmonary:     Effort: Pulmonary effort is normal. No respiratory distress.     Breath sounds: Normal breath sounds.  Abdominal:     General: Abdomen is flat. Bowel sounds are normal. There is no distension.     Palpations: Abdomen is soft.     Tenderness: There is no abdominal tenderness. There is no guarding or rebound.     Hernia: No hernia is present.     Comments: Unable to palpate for hepatomegaly or splenomegaly due to body habitus  Musculoskeletal:        General:  Normal range of motion.     Cervical back: Normal range of motion and neck supple. No tenderness.     Right lower leg: No edema.     Left lower leg: No edema.     Comments: Normal tone, strength 5/5 BUE & BLE  Feet:     Right foot:  Skin integrity: Skin integrity normal.     Left foot:     Skin integrity: Skin integrity normal.  Lymphadenopathy:     Cervical: No cervical adenopathy.  Skin:    General: Skin is warm and dry.     Capillary Refill: Capillary refill takes less than 2 seconds.  Neurological:     General: No focal deficit present.     Mental Status: She is alert and oriented to person, place, and time.     Cranial Nerves: No cranial nerve deficit.     Sensory: No sensory deficit.     Motor: No weakness.     Coordination: Coordination normal.     Gait: Gait normal.     Deep Tendon Reflexes: Reflexes normal.  Psychiatric:        Attention and Perception: Attention and perception normal.        Mood and Affect: Mood and affect normal.        Speech: Speech normal.        Behavior: Behavior normal. Behavior is cooperative.        Thought Content: Thought content normal.        Cognition and Memory: Cognition and memory normal.        Judgment: Judgment normal.     Results for orders placed or performed in visit on 05/15/19  Comprehensive metabolic panel  Result Value Ref Range   Glucose, Bld 121 (H) 65 - 99 mg/dL   BUN 17 7 - 25 mg/dL   Creat 0.75 0.50 - 1.05 mg/dL   BUN/Creatinine Ratio NOT APPLICABLE 6 - 22 (calc)   Sodium 137 135 - 146 mmol/L   Potassium 4.0 3.5 - 5.3 mmol/L   Chloride 101 98 - 110 mmol/L   CO2 24 20 - 32 mmol/L   Calcium 10.5 (H) 8.6 - 10.4 mg/dL   Total Protein 8.3 (H) 6.1 - 8.1 g/dL   Albumin 4.8 3.6 - 5.1 g/dL   Globulin 3.5 1.9 - 3.7 g/dL (calc)   AG Ratio 1.4 1.0 - 2.5 (calc)   Total Bilirubin 0.3 0.2 - 1.2 mg/dL   Alkaline phosphatase (APISO) 82 37 - 153 U/L   AST 27 10 - 35 U/L   ALT 40 (H) 6 - 29 U/L  Hemoglobin A1c  Result  Value Ref Range   Hgb A1c MFr Bld 6.3 (H) <5.7 % of total Hgb   Mean Plasma Glucose 134 (calc)   eAG (mmol/L) 7.4 (calc)  TSH  Result Value Ref Range   TSH 3.81 0.40 - 4.50 mIU/L  CBC with Differential/Platelet  Result Value Ref Range   WBC 7.3 3.8 - 10.8 Thousand/uL   RBC 4.75 3.80 - 5.10 Million/uL   Hemoglobin 13.4 11.7 - 15.5 g/dL   HCT 39.7 35.0 - 45.0 %   MCV 83.6 80.0 - 100.0 fL   MCH 28.2 27.0 - 33.0 pg   MCHC 33.8 32.0 - 36.0 g/dL   RDW 13.3 11.0 - 15.0 %   Platelets 266 140 - 400 Thousand/uL   MPV 11.2 7.5 - 12.5 fL   Neutro Abs 4,314 1,500 - 7,800 cells/uL   Lymphs Abs 2,300 850 - 3,900 cells/uL   Absolute Monocytes 460 200 - 950 cells/uL   Eosinophils Absolute 197 15 - 500 cells/uL   Basophils Absolute 29 0 - 200 cells/uL   Neutrophils Relative % 59.1 %   Total Lymphocyte 31.5 %   Monocytes Relative 6.3 %   Eosinophils Relative 2.7 %  Basophils Relative 0.4 %  Lipid panel  Result Value Ref Range   Cholesterol 166 <200 mg/dL   HDL 44 (L) > OR = 50 mg/dL   Triglycerides 451 (H) <150 mg/dL   LDL Cholesterol (Calc)  mg/dL (calc)   Total CHOL/HDL Ratio 3.8 <5.0 (calc)   Non-HDL Cholesterol (Calc) 122 <130 mg/dL (calc)      Assessment & Plan:   Problem List Items Addressed This Visit   None   Visit Diagnoses    Encounter to establish care with new doctor    -  Primary   Relevant Orders   Ambulatory referral to Obstetrics / Gynecology   Hypercalcemia       Relevant Orders   COMPLETE METABOLIC PANEL WITH GFR   Vitamin D 1,25 dihydroxy   VITAMIN D 25 Hydroxy (Vit-D Deficiency, Fractures)   PTH-related peptide   PTH, Intact and Calcium   Annual physical exam       Relevant Orders   CBC with Differential   Lipid Profile   TSH + free T4   Prediabetes       Relevant Orders   HgB A1c   Screening examination for sexually transmitted disease       Relevant Orders   HIV antibody (with reflex)   RPR   Urine cytology ancillary only   Screening for  osteoporosis       Relevant Orders   DG Bone Density   Encounter for screening mammogram for malignant neoplasm of breast       Relevant Orders   MM 3D SCREEN BREAST BILATERAL      No orders of the defined types were placed in this encounter.  Follow up plan: Return in about 1 year (around 04/22/2021) for CPE.  Harlin Rain, FNP-C Family Nurse Practitioner [facility] 04/22/2020, 8:43 AM

## 2020-04-22 NOTE — Assessment & Plan Note (Signed)
Pt postmenopausal w/out history of prior DEXA scan.    Plan: 1. Obtain DG bone density.

## 2020-04-22 NOTE — Assessment & Plan Note (Signed)
A1C 6.3% at last lab drawn 05/2019, will have repeat A1C drawn with labs today.

## 2020-04-22 NOTE — Assessment & Plan Note (Signed)
Hx of hypercalcemia on labs 05/2019, will have repeat CMP and hypercalcemia work up labs ordered/drawn today.

## 2020-04-22 NOTE — Patient Instructions (Signed)
A referral to OB/GYN has been placed today.  If you have not heard from the specialty office or our referral coordinator within 1 week, please let us know and we will follow up with the referral coordinator for an update.  For Mammogram screening for breast cancer and DEXA Scan (Bone mineral density) screening for osteoporosis  Call the [place] below anytime to schedule your own appointment now that order has been placed.  [facility] [address], [place] [address] Phone: [phone]  [facility] [address] [place], [place] [address] Phone: [phone]  Have your labs drawn and we will contact you with the results  Well Visit: Care Instructions Overview  Well visits can help you stay healthy. Your provider has checked your overall health and may have suggested ways to take good care of yourself. Your provider also may have recommended tests. At home, you can help prevent illness with healthy eating, regular exercise, and other steps.  Follow-up care is a key part of your treatment and safety. Be sure to make and go to all appointments, and call your provider if you are having problems. It's also a good idea to know your test results and keep a list of the medicines you take.  How can you care for yourself at home?   Get screening tests that you and your doctor decide on. Screening helps find diseases before any symptoms appear.   Eat healthy foods. Choose fruits, vegetables, whole grains, protein, and low-fat dairy foods. Limit fat, especially saturated fat. Reduce salt in your diet.   Limit alcohol. If you are a man, have no more than 2 drinks a day or 14 drinks a week. If you are a woman, have no more than 1 drink a day or 7 drinks a week.   Get at least 30 minutes of physical activity on most days of the week.  We recommend you go no more than 2 days in a row without exercise. Walking is  a good choice. You also may want to do other activities, such as running, swimming, cycling, or playing tennis or team sports. Discuss any changes in your exercise program with your provider.   Reach and stay at a healthy weight. This will lower your risk for many problems, such as obesity, diabetes, heart disease, and high blood pressure.   Do not smoke or allow others to smoke around you. If you need help quitting, talk to your provider about stop-smoking programs and medicines. These can increase your chances of quitting for good.  Can call 1-800-QUIT-NOW 4420016607) for the [facility], assistance with smoking cessation.   Care for your mental health. It is easy to get weighed down by worry and stress. Learn strategies to manage stress, like deep breathing and mindfulness, and stay connected with your family and community. If you find you often feel sad or hopeless, talk with your provider. Treatment can help.   Talk to your provider about whether you have any risk factors for sexually transmitted infections (STIs). You can help prevent STIs if you wait to have sex with a new partner (or partners) until you've each been tested for STIs. It also helps if you use condoms (female or female condoms) and if you limit your sex partners to one person who only has sex with you. Vaccines are available for some STIs, such as HPV (these are age dependent).   If you think you may  have a problem with alcohol or drug use, talk to your provider. This includes prescription medicines (such as amphetamines and opioids) and illegal drugs (such as cocaine and methamphetamine). Your provider can help you figure out what type of treatment is best for you.   If you have concerns about domestic violence or intimate partner violence, there are resources available to you. National Domestic Abuse Hotline (478) 850-6670   Protect your skin from too much sun. When you're outdoors from 10 a.m. to 4 p.m., stay  in the shade or cover up with clothing and a hat with a wide brim. Wear sunglasses that block UV rays. Even when it's cloudy, put broad-spectrum sunscreen (SPF 30 or higher) on any exposed skin.   See a dentist one or two times a year for checkups and to have your teeth cleaned.   See an eye doctor once per year for an eye exam.   Wear a seat belt in the car.  When should you call for help?  Watch closely for changes in your health, and be sure to contact your provider if you have any problems or symptoms that concern you.  We will plan to see you back in 12 months for your physical  You will receive a survey after today's visit either digitally by e-mail or paper by [place] mail. Your experiences and feedback matter to [place].  Please respond so we know how we are doing as we provide care for you.  Call us with any questions/concerns/needs.  It is my goal to be available to you for your health concerns.  Thanks for choosing me to be a partner in your healthcare needs!  Harlin Rain, FNP-C Family Nurse Practitioner [facility] Phone: [phone]

## 2020-04-22 NOTE — Assessment & Plan Note (Signed)
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  No medical records to reflect this.  Referral to OB/GYN placed.

## 2020-04-22 NOTE — Assessment & Plan Note (Signed)
Screening for HIV, RPR on labs and urine for GC/CT/Trich.  Denies concerns but would like yearly screening.  Will have drawn today and sent to lab.

## 2020-04-22 NOTE — Assessment & Plan Note (Signed)
Pt last mammogram unknown.   Plan: 1. Screening mammogram order placed.  Pt will call to schedule appointment.  Information given.  

## 2020-04-23 ENCOUNTER — Telehealth: Payer: Self-pay

## 2020-04-23 LAB — URINE CYTOLOGY ANCILLARY ONLY
Chlamydia: NEGATIVE
Comment: NEGATIVE
Comment: NEGATIVE
Comment: NORMAL
Neisseria Gonorrhea: NEGATIVE
Trichomonas: NEGATIVE

## 2020-04-23 NOTE — Telephone Encounter (Signed)
[facility] referring for Encounter to establish care with new doctor. Both Phones on file are not accepting calls at this time. Unable to leave message for patient to call back to be scheduled.

## 2020-04-23 NOTE — Telephone Encounter (Signed)
Copied from [place] [id number]. Topic: General - Other >> Apr 23, 2020  2:47 PM Yvette Rack wrote: Reason for CRM: Pt called for most recent lab results. Pt requests call back. Cb# [phone]

## 2020-04-23 NOTE — Telephone Encounter (Signed)
Pt notified of the provides recommendation. Appt scheduled for Monday, Jan. 24th.

## 2020-04-27 ENCOUNTER — Encounter: Payer: Self-pay | Admitting: Family Medicine

## 2020-04-27 ENCOUNTER — Telehealth (INDEPENDENT_AMBULATORY_CARE_PROVIDER_SITE_OTHER): Payer: BC Managed Care – PPO | Admitting: Family Medicine

## 2020-04-27 ENCOUNTER — Other Ambulatory Visit: Payer: Self-pay

## 2020-04-27 VITALS — BP 143/82 | HR 88 | Resp 17 | Ht 60.0 in | Wt 161.8 lb

## 2020-04-27 DIAGNOSIS — E781 Pure hyperglyceridemia: Secondary | ICD-10-CM | POA: Diagnosis not present

## 2020-04-27 DIAGNOSIS — R748 Abnormal levels of other serum enzymes: Secondary | ICD-10-CM | POA: Diagnosis not present

## 2020-04-27 DIAGNOSIS — E1165 Type 2 diabetes mellitus with hyperglycemia: Secondary | ICD-10-CM | POA: Diagnosis not present

## 2020-04-27 MED ORDER — METFORMIN HCL 500 MG PO TABS: 500.0000 mg | ORAL_TABLET | Freq: Two times a day (BID) | ORAL | 3 refills | Status: DC

## 2020-04-27 NOTE — Progress Notes (Signed)
Subjective:    Patient ID: Courtney Mccarty, female    DOB: Oct 03, 1960, 60 y.o.   MRN: [id number]  Courtney Mccarty is a 60 y.o. female presenting on 04/27/2020 for Abnormal laboratory Result (Abnormal A1C 9.3%, Abnormal LFT's, and Triglycerides )   HPI  Ms. Dlugosz presents to clinic for discussion of her labs.  Has history of prediabetes with new diagnosis of diabetes.  Denies any polydipsia, polyphagia, polyuria, headaches, diaphoresis, shakiness, chills, pain, numbness or tingling in extremities or changes in vision.   Depression screen [facility] 2/9 05/15/2019 05/01/2018 05/01/2018  Decreased Interest 0 0 0  Down, Depressed, Hopeless 0 0 0  PHQ - 2 Score 0 0 0  Altered sleeping - 0 -  Tired, decreased energy - 0 -  Change in appetite - 0 -  Feeling bad or failure about yourself  - 0 -  Trouble concentrating - 0 -  Moving slowly or fidgety/restless - 0 -  Suicidal thoughts - 0 -  PHQ-9 Score - 0 -    Social History   Tobacco Use  . Smoking status: Never Smoker  . Smokeless tobacco: Never Used  Vaping Use  . Vaping Use: Never used  Substance Use Topics  . Alcohol use: Yes    Alcohol/week: 20.0 standard drinks    Types: 20 Cans of beer per week  . Drug use: No    Review of Systems  Constitutional: Negative.   HENT: Negative.   Eyes: Negative.   Respiratory: Negative.   Cardiovascular: Negative.   Gastrointestinal: Negative.   Endocrine: Negative.   Genitourinary: Negative.   Musculoskeletal: Negative.   Skin: Negative.   Allergic/Immunologic: Negative.   Neurological: Negative.   Hematological: Negative.   Psychiatric/Behavioral: Negative.    Per HPI unless specifically indicated above     Objective:    BP (!) 143/82 (BP Location: Right Arm, Patient Position: Sitting, Cuff Size: Normal)   Pulse 88   Resp 17   Ht 5' (1.524 m)   Wt 161 lb 12.8 oz (73.4 kg)   LMP 04/05/2015 (Approximate)   SpO2 99%   BMI 31.60 kg/m   Wt Readings from Last 3 Encounters:  04/27/20  161 lb 12.8 oz (73.4 kg)  04/22/20 160 lb 9.6 oz (72.8 kg)  09/16/19 162 lb 12.8 oz (73.8 kg)    Physical Exam Vitals and nursing note reviewed.  Constitutional:      General: She is not in acute distress.    Appearance: Normal appearance. She is well-developed and well-groomed. She is obese. She is not ill-appearing or toxic-appearing.  HENT:     Head: Normocephalic and atraumatic.     Nose:     Comments: Lizbeth Bark is in place, covering mouth and nose. Eyes:     General: Lids are normal. Vision grossly intact.        Right eye: No discharge.        Left eye: No discharge.     Extraocular Movements: Extraocular movements intact.     Conjunctiva/sclera: Conjunctivae normal.     Pupils: Pupils are equal, round, and reactive to light.  Cardiovascular:     Rate and Rhythm: Normal rate and regular rhythm.     Pulses: Normal pulses.     Heart sounds: Normal heart sounds. No murmur heard. No friction rub. No gallop.   Pulmonary:     Effort: Pulmonary effort is normal. No respiratory distress.     Breath sounds: Normal breath sounds.  Musculoskeletal:  Right lower leg: No edema.     Left lower leg: No edema.  Skin:    General: Skin is warm and dry.     Capillary Refill: Capillary refill takes less than 2 seconds.  Neurological:     General: No focal deficit present.     Mental Status: She is alert and oriented to person, place, and time.  Psychiatric:        Attention and Perception: Attention and perception normal.        Mood and Affect: Mood and affect normal.        Speech: Speech normal.        Behavior: Behavior normal. Behavior is cooperative.        Thought Content: Thought content normal.        Cognition and Memory: Cognition and memory normal.        Judgment: Judgment normal.    Results for orders placed or performed in visit on 04/22/20  CBC with Differential  Result Value Ref Range   WBC 4.3 3.8 - 10.8 Thousand/uL   RBC 5.11 (H) 3.80 - 5.10 Million/uL    Hemoglobin 15.3 11.7 - 15.5 g/dL   HCT 44.3 35.0 - 45.0 %   MCV 86.7 80.0 - 100.0 fL   MCH 29.9 27.0 - 33.0 pg   MCHC 34.5 32.0 - 36.0 g/dL   RDW 12.3 11.0 - 15.0 %   Platelets 210 140 - 400 Thousand/uL   MPV 11.4 7.5 - 12.5 fL   Neutro Abs 2,025 1,500 - 7,800 cells/uL   Lymphs Abs 1,939 850 - 3,900 cells/uL   Absolute Monocytes 284 200 - 950 cells/uL   Eosinophils Absolute 30 15 - 500 cells/uL   Basophils Absolute 22 0 - 200 cells/uL   Neutrophils Relative % 47.1 %   Total Lymphocyte 45.1 %   Monocytes Relative 6.6 %   Eosinophils Relative 0.7 %   Basophils Relative 0.5 %  COMPLETE METABOLIC PANEL WITH GFR  Result Value Ref Range   Glucose, Bld 255 (H) 65 - 99 mg/dL   BUN 12 7 - 25 mg/dL   Creat 0.78 0.50 - 1.05 mg/dL   GFR, Est Non African American 84 > OR = 60 mL/min/1.58m2   GFR, Est African American 97 > OR = 60 mL/min/1.37m2   BUN/Creatinine Ratio NOT APPLICABLE 6 - 22 (calc)   Sodium 136 135 - 146 mmol/L   Potassium 3.9 3.5 - 5.3 mmol/L   Chloride 95 (L) 98 - 110 mmol/L   CO2 31 20 - 32 mmol/L   Calcium 10.4 8.6 - 10.4 mg/dL   Total Protein 8.4 (H) 6.1 - 8.1 g/dL   Albumin 4.7 3.6 - 5.1 g/dL   Globulin 3.7 1.9 - 3.7 g/dL (calc)   AG Ratio 1.3 1.0 - 2.5 (calc)   Total Bilirubin 0.4 0.2 - 1.2 mg/dL   Alkaline phosphatase (APISO) 90 37 - 153 U/L   AST 95 (H) 10 - 35 U/L   ALT 97 (H) 6 - 29 U/L  Lipid Profile  Result Value Ref Range   Cholesterol 159 <200 mg/dL   HDL 33 (L) > OR = 50 mg/dL   Triglycerides 704 (H) <150 mg/dL   LDL Cholesterol (Calc)  mg/dL (calc)   Total CHOL/HDL Ratio 4.8 <5.0 (calc)   Non-HDL Cholesterol (Calc) 126 <130 mg/dL (calc)  TSH + free T4  Result Value Ref Range   TSH W/REFLEX TO FT4 3.49 0.40 - 4.50 mIU/L  HgB A1c  Result Value Ref Range   Hgb A1c MFr Bld 9.3 (H) <5.7 % of total Hgb   Mean Plasma Glucose 220 mg/dL   eAG (mmol/L) 12.2 mmol/L  Vitamin D 1,25 dihydroxy  Result Value Ref Range   Vitamin D 1, 25 (OH)2 Total 47 18 - 72  pg/mL   Vitamin D3 1, 25 (OH)2 47 pg/mL   Vitamin D2 1, 25 (OH)2 <8 pg/mL  VITAMIN D 25 Hydroxy (Vit-D Deficiency, Fractures)  Result Value Ref Range   Vit D, 25-Hydroxy 51 30 - 100 ng/mL  PTH, Intact and Calcium  Result Value Ref Range   PTH 31 14 - 64 pg/mL   Calcium 10.4 8.6 - 10.4 mg/dL  HIV antibody (with reflex)  Result Value Ref Range   HIV 1&2 Ab, 4th Generation NON-REACTIVE NON-REACTI  RPR  Result Value Ref Range   RPR Ser Ql NON-REACTIVE NON-REACTI  Urine cytology ancillary only  Result Value Ref Range   Trichomonas Negative    Chlamydia Negative    Neisseria Gonorrhea Negative    Comment Normal Reference [place] Chlamydia - Negative    Comment Normal Reference Range Trichomonas - Negative    Comment      Normal Reference Range Neisseria Gonorrhea - Negative      Assessment & Plan:   Problem List Items Addressed This Visit      Endocrine   Type 2 diabetes mellitus with hyperglycemia, without long-term current use of insulin (HCC) - Primary    New diagnosis of T2DM made, spent time reviewing dietary and lifestyle modifications along with starting metformin 500mg  BID and ensuring to take with food.  Declined learning to use the glucometer or for rx for taking home glucose readings.  Will work on lifestyle and dietary modifications, begin metformin 500mg  BID WC and RTC in 3 months for repeat labs and A1C      Relevant Medications   metFORMIN (GLUCOPHAGE) 500 MG tablet     Other   Hypertriglyceridemia    Elevated triglycerides, likely secondary to increased ETOH intake.  Patient counseled on reduction of ETOH intake and can plan to repeat lipids in 3 months.      Relevant Orders   Lipid Profile   Elevated liver enzymes    Elevated LFTs, reports she has been having an increase in her alcohol intake, is unable to tell how many drinks per day/per week.  Reports she will be decreasing her ETOH intake and we will plan to repeat her LFTs in 8-12 weeks.  Patient in  agreement with plan.      Relevant Orders   COMPLETE METABOLIC PANEL WITH GFR      Meds ordered this encounter  Medications  . metFORMIN (GLUCOPHAGE) 500 MG tablet    Sig: Take 1 tablet (500 mg total) by mouth 2 (two) times daily with a meal.    Dispense:  180 tablet    Refill:  3   Follow up plan: Return in about 3 months (around 07/26/2020) for T2DM, A1C.   Harlin Rain, [place] Family Nurse Practitioner [facility] 04/27/2020, 1:27 PM

## 2020-04-27 NOTE — Assessment & Plan Note (Signed)
Elevated LFTs, reports she has been having an increase in her alcohol intake, is unable to tell how many drinks per day/per week.  Reports she will be decreasing her ETOH intake and we will plan to repeat her LFTs in 8-12 weeks.  Patient in agreement with plan.

## 2020-04-27 NOTE — Assessment & Plan Note (Signed)
Elevated triglycerides, likely secondary to increased ETOH intake.  Patient counseled on reduction of ETOH intake and can plan to repeat lipids in 3 months.

## 2020-04-27 NOTE — Assessment & Plan Note (Signed)
New diagnosis of T2DM made, spent time reviewing dietary and lifestyle modifications along with starting metformin 500mg  BID and ensuring to take with food.  Declined learning to use the glucometer or for rx for taking home glucose readings.  Will work on lifestyle and dietary modifications, begin metformin 500mg  BID WC and RTC in 3 months for repeat labs and A1C

## 2020-04-28 LAB — COMPLETE METABOLIC PANEL WITH GFR
AG Ratio: 1.3 (calc) (ref 1.0–2.5)
ALT: 97 U/L — ABNORMAL HIGH (ref 6–29)
AST: 95 U/L — ABNORMAL HIGH (ref 10–35)
Albumin: 4.7 g/dL (ref 3.6–5.1)
Alkaline phosphatase (APISO): 90 U/L (ref 37–153)
BUN: 12 mg/dL (ref 7–25)
CO2: 31 mmol/L (ref 20–32)
Calcium: 10.4 mg/dL (ref 8.6–10.4)
Chloride: 95 mmol/L — ABNORMAL LOW (ref 98–110)
Creat: 0.78 mg/dL (ref 0.50–1.05)
GFR, Est African American: 97 mL/min/{1.73_m2} (ref >=60)
GFR, Est Non African American: 84 mL/min/{1.73_m2} (ref >=60)
Globulin: 3.7 g/dL (calc) (ref 1.9–3.7)
Glucose, Bld: 255 mg/dL — ABNORMAL HIGH (ref 65–99)
Potassium: 3.9 mmol/L (ref 3.5–5.3)
Sodium: 136 mmol/L (ref 135–146)
Total Bilirubin: 0.4 mg/dL (ref 0.2–1.2)
Total Protein: 8.4 g/dL — ABNORMAL HIGH (ref 6.1–8.1)

## 2020-04-28 LAB — CBC WITH DIFFERENTIAL/PLATELET
Absolute Monocytes: 284 cells/uL (ref 200–950)
Basophils Absolute: 22 cells/uL (ref 0–200)
Basophils Relative: 0.5 %
Eosinophils Absolute: 30 cells/uL (ref 15–500)
Eosinophils Relative: 0.7 %
HCT: 44.3 % (ref 35.0–45.0)
Hemoglobin: 15.3 g/dL (ref 11.7–15.5)
Lymphs Abs: 1939 cells/uL (ref 850–3900)
MCH: 29.9 pg (ref 27.0–33.0)
MCHC: 34.5 g/dL (ref 32.0–36.0)
MCV: 86.7 fL (ref 80.0–100.0)
MPV: 11.4 fL (ref 7.5–12.5)
Monocytes Relative: 6.6 %
Neutro Abs: 2025 cells/uL (ref 1500–7800)
Neutrophils Relative %: 47.1 %
Platelets: 210 10*3/uL (ref 140–400)
RBC: 5.11 10*6/uL — ABNORMAL HIGH (ref 3.80–5.10)
RDW: 12.3 % (ref 11.0–15.0)
Total Lymphocyte: 45.1 %
WBC: 4.3 10*3/uL (ref 3.8–10.8)

## 2020-04-28 LAB — VITAMIN D 25 HYDROXY (VIT D DEFICIENCY, FRACTURES): Vit D, 25-Hydroxy: 51 ng/mL (ref 30–100)

## 2020-04-28 LAB — LIPID PANEL
Cholesterol: 159 mg/dL (ref <200)
HDL: 33 mg/dL — ABNORMAL LOW (ref >=50)
Non-HDL Cholesterol (Calc): 126 mg/dL (calc) (ref <130)
Total CHOL/HDL Ratio: 4.8 (calc) (ref <5.0)
Triglycerides: 704 mg/dL — ABNORMAL HIGH (ref <150)

## 2020-04-28 LAB — VITAMIN D 1,25 DIHYDROXY
Vitamin D 1, 25 (OH)2 Total: 47 pg/mL (ref 18–72)
Vitamin D2 1, 25 (OH)2: <8 pg/mL
Vitamin D3 1, 25 (OH)2: 47 pg/mL

## 2020-04-28 LAB — PTH, INTACT AND CALCIUM
Calcium: 10.4 mg/dL (ref 8.6–10.4)
PTH: 31 pg/mL (ref 14–64)

## 2020-04-28 LAB — HEMOGLOBIN A1C
Hgb A1c MFr Bld: 9.3 % of total Hgb — ABNORMAL HIGH (ref <5.7)
Mean Plasma Glucose: 220 mg/dL
eAG (mmol/L): 12.2 mmol/L

## 2020-04-28 LAB — TSH+FREE T4: TSH W/REFLEX TO FT4: 3.49 mIU/L (ref 0.40–4.50)

## 2020-04-28 LAB — HIV ANTIBODY (ROUTINE TESTING W REFLEX): HIV 1&2 Ab, 4th Generation: NONREACTIVE

## 2020-04-28 LAB — RPR: RPR Ser Ql: NONREACTIVE

## 2020-04-28 LAB — PTH-RELATED PEPTIDE: PTH-Related Protein (PTH-RP): 8 pg/mL — ABNORMAL LOW (ref 11–20)

## 2020-04-28 NOTE — Telephone Encounter (Signed)
Called and left voicemail for patient to call back to be scheduled. 

## 2020-04-29 ENCOUNTER — Other Ambulatory Visit: Payer: Self-pay

## 2020-04-29 ENCOUNTER — Telehealth: Payer: Self-pay | Admitting: Family Medicine

## 2020-05-13 ENCOUNTER — Inpatient Hospital Stay: Admission: RE | Admit: 2020-05-13 | Payer: Self-pay | Source: Ambulatory Visit

## 2020-06-04 ENCOUNTER — Other Ambulatory Visit: Payer: Self-pay | Admitting: Family Medicine

## 2020-06-04 DIAGNOSIS — I10 Essential (primary) hypertension: Secondary | ICD-10-CM

## 2020-06-04 LAB — HM PAP SMEAR: HM Pap smear: ABNORMAL

## 2020-06-11 ENCOUNTER — Telehealth: Payer: Self-pay

## 2020-06-11 NOTE — Telephone Encounter (Signed)
Copied from [place] [id number]. Topic: Quick Communication - Other Results (Clinic Use ONLY) >> Jun 11, 2020  8:09 AM Lennox Solders wrote: Pt is calling and would like pap smear results   I have called and spoken with the patient and informed her to call her OBGYN at [facility] for pap results because their office collected the sample.   She also requested a copy of recent lab results from 1/19. A flow sheet has been printed and she will pick it up some time today.

## 2020-06-23 ENCOUNTER — Other Ambulatory Visit: Payer: Self-pay

## 2020-06-23 ENCOUNTER — Encounter: Payer: Self-pay | Admitting: Unknown Physician Specialty

## 2020-06-23 ENCOUNTER — Ambulatory Visit (INDEPENDENT_AMBULATORY_CARE_PROVIDER_SITE_OTHER): Payer: BC Managed Care – PPO | Admitting: Unknown Physician Specialty

## 2020-06-23 VITALS — BP 128/70 | HR 72 | Temp 97.3°F | Ht 61.0 in | Wt 163.6 lb

## 2020-06-23 DIAGNOSIS — Z148 Genetic carrier of other disease: Secondary | ICD-10-CM

## 2020-06-23 DIAGNOSIS — R002 Palpitations: Secondary | ICD-10-CM

## 2020-06-23 NOTE — Progress Notes (Signed)
BP 128/70 (BP Location: Left Arm, Patient Position: Sitting, Cuff Size: Normal)   Pulse 72   Temp (!) 97.3 F (36.3 C) (Temporal)   Ht 5\' 1"  (1.549 m)   Wt 163 lb 9.6 oz (74.2 kg)   LMP 04/05/2015 (Approximate)   SpO2 100%   BMI 30.91 kg/m    Subjective:    Patient ID: Courtney Mccarty, female    DOB: [birth year], 60 y.o.   MRN: [id number]  HPI: Courtney Mccarty is a 60 y.o. female  Chief Complaint  Patient presents with  . Palpitations    Pt states has been having palpitations for years and has not brought it up till now. She states its last for a few minutes and its not a everyday or weekly, it happens randomly. Pt would also like referral for an ultrasound.   Elevated cancer risk Pt states she would like a pelvic ultrasound in [place] as a screening as it runs in the family and says she has a cancer gene. She went to [facility] for a pap smear earlier in the month showing ASCUS with negative HPV.  She was not given the results.  She asked them to do a pelvic US but they did not.  They asked for notes from [place] before making a decision.  She wants to go elsewhere.    Palpitations For 2 weeks.  States she is having a "slight hurt" but thinks she has had many heart attacks without knowing it.  This comes and goes and not sure what makes it worse or better.  States they last a few minutes and not daily.  Denies SOB.  Mother has heart problems.    Family History  Problem Relation Age of Onset  . Diabetes Mother   . Hypertension Mother   . Heart disease Mother   . Depression Mother   . Diabetes Father   . Hypertension Father   . Diabetes Maternal Grandmother   . Cancer Maternal Grandfather   . Diabetes Paternal Grandmother   . Cancer Paternal Grandfather    Past Medical History:  Diagnosis Date  . Essential hypertension 10/11/2016   Past Surgical History:  Procedure Laterality Date  . COLONOSCOPY WITH PROPOFOL N/A 11/28/2016   Procedure: COLONOSCOPY WITH PROPOFOL;  Surgeon:  Lucilla Lame, MD;  Location: [facility];  Service: Gastroenterology;  Laterality: N/A;  . NO PAST SURGERIES    . POLYPECTOMY N/A 11/28/2016   Procedure: POLYPECTOMY;  Surgeon: Lucilla Lame, MD;  Location: [facility];  Service: Gastroenterology;  Laterality: N/A;     Relevant past medical, surgical, family and social history reviewed and updated as indicated. Interim medical history since our last visit reviewed. Allergies and medications reviewed and updated.  Review of Systems  Constitutional: Negative for chills, fatigue, fever and unexpected weight change.  HENT: Negative.   Eyes: Negative.   Respiratory: Negative for shortness of breath.   Cardiovascular: Positive for chest pain and palpitations. Negative for leg swelling.  Gastrointestinal: Negative.   Skin: Negative.     Per HPI unless specifically indicated above     Objective:    BP 128/70 (BP Location: Left Arm, Patient Position: Sitting, Cuff Size: Normal)   Pulse 72   Temp (!) 97.3 F (36.3 C) (Temporal)   Ht 5\' 1"  (1.549 m)   Wt 163 lb 9.6 oz (74.2 kg)   LMP 04/05/2015 (Approximate)   SpO2 100%   BMI 30.91 kg/m   Wt Readings from Last 3 Encounters:  06/23/20 163 lb 9.6 oz (74.2 kg)  04/27/20 161 lb 12.8 oz (73.4 kg)  04/22/20 160 lb 9.6 oz (72.8 kg)    Physical Exam Constitutional:      General: She is not in acute distress.    Appearance: Normal appearance. She is well-developed.  HENT:     Head: Normocephalic and atraumatic.  Eyes:     General: Lids are normal. No scleral icterus.       Right eye: No discharge.        Left eye: No discharge.     Conjunctiva/sclera: Conjunctivae normal.  Neck:     Vascular: No carotid bruit or JVD.  Cardiovascular:     Rate and Rhythm: Normal rate and regular rhythm.     Heart sounds: Normal heart sounds.  Pulmonary:     Effort: Pulmonary effort is normal.     Breath sounds: Normal breath sounds.  Abdominal:     Palpations: There is no  hepatomegaly or splenomegaly.  Musculoskeletal:        General: Normal range of motion.     Cervical back: Normal range of motion and neck supple.  Skin:    General: Skin is warm and dry.     Coloration: Skin is not pale.     Findings: No rash.  Neurological:     Mental Status: She is alert and oriented to person, place, and time.  Psychiatric:        Behavior: Behavior normal.        Thought Content: Thought content normal.        Judgment: Judgment normal.   EKG without ST/TW changes     Assessment & Plan:   Problem List Items Addressed This Visit   None   Visit Diagnoses    Palpitations    -  Primary   New onset. No acute EKG changes. High risk for CVD.  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  Will refer to Gyn as was unhappy at [facility]   Relevant Orders   Ambulatory referral to Gynecology       Follow up plan: Return in about 4 weeks (around 07/21/2020) for Diabetes check.

## 2020-06-25 ENCOUNTER — Other Ambulatory Visit: Payer: Self-pay

## 2020-06-25 ENCOUNTER — Encounter: Payer: Self-pay | Admitting: Internal Medicine

## 2020-06-25 ENCOUNTER — Ambulatory Visit (INDEPENDENT_AMBULATORY_CARE_PROVIDER_SITE_OTHER): Payer: BC Managed Care – PPO

## 2020-06-25 ENCOUNTER — Ambulatory Visit (INDEPENDENT_AMBULATORY_CARE_PROVIDER_SITE_OTHER): Payer: BC Managed Care – PPO | Admitting: Internal Medicine

## 2020-06-25 VITALS — BP 120/64 | HR 80 | Ht 61.0 in | Wt 164.2 lb

## 2020-06-25 DIAGNOSIS — E785 Hyperlipidemia, unspecified: Secondary | ICD-10-CM

## 2020-06-25 DIAGNOSIS — I10 Essential (primary) hypertension: Secondary | ICD-10-CM

## 2020-06-25 DIAGNOSIS — R079 Chest pain, unspecified: Secondary | ICD-10-CM

## 2020-06-25 DIAGNOSIS — E1169 Type 2 diabetes mellitus with other specified complication: Secondary | ICD-10-CM

## 2020-06-25 DIAGNOSIS — R002 Palpitations: Secondary | ICD-10-CM

## 2020-06-25 MED ORDER — METOPROLOL TARTRATE 100 MG PO TABS: ORAL_TABLET | ORAL | 0 refills | Status: DC

## 2020-06-25 NOTE — Progress Notes (Signed)
New Outpatient Visit Date: 06/25/2020  Referring Provider: Kathrine Haddock, NP [address],  [place] [address]  Chief Complaint: Chest pain and palpitations.  HPI:  Courtney Mccarty is a 60 y.o. female who is being seen today for the evaluation of palpitations and chest pain at the request of Courtney Mccarty. She has a history of hypertension, hyperlipidemia, type 2 diabetes mellitus, and obesity.  She was evaluated by Courtney Mccarty earlier this week and made mention of intermittent palpitations and associated chest discomfort.  Left sided, 9/10.  No radiation or associated symptoms.  Doesn't take anything for it.  No exertional.  Some flutters when pain happens.  She was concerned that she may have had heart attacks in the past.  Courtney Mccarty reports having intermittent chest discomfort for over a year.  She describes it as both a sharp and pulsating pain that usually lasts less than an hour.  It is in the left side of her chest and does not radiate.  She wonders if it is positional at times.  It does not seem to be related to eating or exertion.  There are no associated symptoms including shortness of breath, lightheadedness, nausea, or diaphoresis.  Sometimes it feels like her heart is racing when she has chest pain.  She denies a history of personal heart disease or prior heart testing though she is concerned about her family history.  Specifically her mother had heart attack around age 60.  Courtney Mccarty was previously on aspirin but stopped this several years ago due to postmenopausal vaginal bleeding.  She reports having been seen by a gynecologist at [facility] just a few weeks ago.  --------------------------------------------------------------------------------------------------  Cardiovascular History & Procedures: Cardiovascular Problems:  Palpitations  Chest pain  Risk Factors:  Hypertension, hyperlipidemia, type 2 diabetes mellitus, obesity  Cath/PCI:  None  CV Surgery:  None  EP Procedures and  Devices:  None  Non-Invasive Evaluation(s):  None  Recent CV Pertinent Labs: Lab Results  Component Value Date   CHOL 159 04/22/2020   HDL 33 (L) 04/22/2020   LDLCALC  04/22/2020     Comment:     . LDL cholesterol not calculated. Triglyceride levels greater than 400 mg/dL invalidate calculated LDL results. . Reference range: <100 . Desirable range <100 mg/dL for primary prevention;   <70 mg/dL for patients with CHD or diabetic patients  with > or = 2 CHD risk factors. Marland Kitchen LDL-C is now calculated using the Martin-Hopkins  calculation, which is a validated novel method providing  better accuracy than the Friedewald equation in the  estimation of LDL-C.  Cresenciano Genre et al. Annamaria Helling. 0174;944(96): 2061-2068  (http://education.[facility].com/faq/FAQ164)    TRIG 704 (H) 04/22/2020   CHOLHDL 4.8 04/22/2020   K 3.9 04/22/2020   BUN 12 04/22/2020   BUN 12 12/07/2018   CREATININE 0.78 04/22/2020    --------------------------------------------------------------------------------------------------  Past Medical History:  Diagnosis Date  . Essential hypertension 10/11/2016  . Hyperlipidemia   . Type 2 diabetes mellitus ([place])     Past Surgical History:  Procedure Laterality Date  . COLONOSCOPY WITH PROPOFOL N/A 11/28/2016   Procedure: COLONOSCOPY WITH PROPOFOL;  Surgeon: Lucilla Lame, MD;  Location: [facility];  Service: Gastroenterology;  Laterality: N/A;  . NO PAST SURGERIES    . POLYPECTOMY N/A 11/28/2016   Procedure: POLYPECTOMY;  Surgeon: Lucilla Lame, MD;  Location: [facility];  Service: Gastroenterology;  Laterality: N/A;    Current Meds  Medication Sig  . cholecalciferol (VITAMIN D3) 25 MCG (  1000 UNIT) tablet Take 1,000 Units by mouth daily.  Marland Kitchen ELDERBERRY PO Take by mouth.  . hydrochlorothiazide (HYDRODIURIL) 12.5 MG tablet Take 1 tablet by mouth once daily  . metFORMIN (GLUCOPHAGE) 500 MG tablet Take 1 tablet (500 mg total) by mouth 2 (two) times  daily with a meal.  . metoprolol tartrate (LOPRESSOR) 100 MG tablet Take 1 tablet (100mg ) 2 hours prior to your Cardiac CT  . Omega-3 Fatty Acids (FISH OIL) 1000 MG CAPS Take by mouth.  . Zinc Sulfate (ZINC 15 PO) Take by mouth.    Allergies: Patient has no known allergies.  Social History   Tobacco Use  . Smoking status: Never Smoker  . Smokeless tobacco: Never Used  Vaping Use  . Vaping Use: Never used  Substance Use Topics  . Alcohol use: Yes    Alcohol/week: 12.0 standard drinks    Types: 12 Cans of beer per week  . Drug use: No    Family History  Problem Relation Age of Onset  . Diabetes Mother   . Hypertension Mother   . Depression Mother   . Heart attack Mother 53  . Diabetes Father   . Hypertension Father   . Diabetes Maternal Grandmother   . Cancer Maternal Grandfather   . Diabetes Paternal Grandmother   . Cancer Paternal Grandfather     Review of Systems: A 12-system review of systems was performed and was negative except as noted in the HPI.  --------------------------------------------------------------------------------------------------  Physical Exam: BP 120/64 (BP Location: Right Arm, Patient Position: Sitting, Cuff Size: Normal)   Pulse 80   Ht 5\' 1"  (1.549 m)   Wt 164 lb 4 oz (74.5 kg)   LMP 04/05/2015 (Approximate)   SpO2 98%   BMI 31.03 kg/m   General: NAD. HEENT: No conjunctival pallor or scleral icterus. Facemask in place. Neck: Supple without lymphadenopathy, thyromegaly, JVD, or HJR. No carotid bruit. Lungs: Normal work of breathing. Clear to auscultation bilaterally without wheezes or crackles. Heart: Regular rate and rhythm without murmurs, rubs, or gallops. Non-displaced PMI. Abd: Bowel sounds present. Soft, NT/ND without hepatosplenomegaly Ext: No lower extremity edema. Radial, PT, and DP pulses are 2+ bilaterally Skin: Warm and dry without rash. Neuro: CNIII-XII intact. Strength and fine-touch sensation intact in upper and lower  extremities bilaterally. Psych: Normal mood and affect.  EKG: Normal sinus rhythm without significant abnormality.  Lab Results  Component Value Date   WBC 4.3 04/22/2020   HGB 15.3 04/22/2020   HCT 44.3 04/22/2020   MCV 86.7 04/22/2020   PLT 210 04/22/2020    Lab Results  Component Value Date   NA 136 04/22/2020   K 3.9 04/22/2020   CL 95 (L) 04/22/2020   CO2 31 04/22/2020   BUN 12 04/22/2020   CREATININE 0.78 04/22/2020   GLUCOSE 255 (H) 04/22/2020   ALT 97 (H) 04/22/2020    Lab Results  Component Value Date   CHOL 159 04/22/2020   HDL 33 (L) 04/22/2020   LDLCALC  04/22/2020     Comment:     . LDL cholesterol not calculated. Triglyceride levels greater than 400 mg/dL invalidate calculated LDL results. . Reference range: <100 . Desirable range <100 mg/dL for primary prevention;   <70 mg/dL for patients with CHD or diabetic patients  with > or = 2 CHD risk factors. Marland Kitchen LDL-C is now calculated using the Martin-Hopkins  calculation, which is a validated novel method providing  better accuracy than the Friedewald equation in the  estimation  of LDL-C.  Cresenciano Genre et al. Annamaria Helling. 8828;003(49): 2061-2068  (http://education.[facility].com/faq/FAQ164)    TRIG 704 (H) 04/22/2020   CHOLHDL 4.8 04/22/2020   Lab Results  Component Value Date   HGBA1C 9.3 (H) 04/22/2020   Lab Results  Component Value Date   TSH 3.81 05/15/2019   --------------------------------------------------------------------------------------------------  ASSESSMENT AND PLAN: Atypical chest pain and palpitations: Ms. Josey reports at least 1 year of intermittent left-sided chest pain sometimes accompanied by palpitations.  The discomfort is not exertional.  EKG has isolated Q-wave in lead III but otherwise is normal.  Cardiac risk factors include uncontrolled diabetes mellitus, hypertension, and hyperlipidemia.  She also has a family history of premature ASCVD in her mother, who had an MI  around age 35.  I have recommended that we obtain a 14-day event monitor to assess for arrhythmia contribute to her palpitations as well as a coronary CTA for further assessment of her chest pain.  We discussed addition of low-dose aspirin, though Courtney Mccarty is reluctant to do this given her history of postmenopausal vaginal bleeding.  If there is evidence of significant ASCVD on CT, we will need to readdress addition of low-dose aspirin.  She should continue to follow-up with her PCP and gynecologist in regard to her history of postmenopausal vaginal bleeding.  Hypertension: Blood pressure well controlled today.  No medication changes recommended.  Hyperlipidemia associated with type 2 diabetes mellitus: Recent lipid panel notable for moderately elevated triglycerides (704).  LDL could not be calculated.  We will defer medication changes at this time.  Lifestyle modifications were reinforced.  Repeat lipid panel with direct LDL should be considered down the road.  Ongoing management of Courtney Mccarty.  Follow-up: Return to clinic in 6 weeks.  Nelva Bush, MD 06/25/2020 1:41 PM

## 2020-06-25 NOTE — Patient Instructions (Signed)
Medication Instructions:  Your physician recommends that you continue on your current medications as directed. Please refer to the Current Medication list given to you today.  A one time dose of Metoprolol 100 mg tablet to be taken 2 hours prior to your Cardiac CT has been sent to your pharmacy.  *If you need a refill on your cardiac medications before your next appointment, please call your pharmacy*   Lab Work: You will need lab work 2-3 days prior to your Cardiac CT.  Please have your lab drawn at the [facility] medical mall. You do not need an appt. Lab hours are Mon-Fri 7am-6pm.  If you have labs (blood work) drawn today and your tests are completely normal, you will receive your results only by: Marland Kitchen MyChart Message (if you have MyChart) OR . A paper copy in the mail If you have any lab test that is abnormal or we need to change your treatment, we will call you to review the results.   Testing/Procedures: Your physician has requested that you have cardiac CT. Cardiac computed tomography (CT) is a painless test that uses an x-ray machine to take clear, detailed pictures of your heart. For further information please visit [email or web address]. Please follow instruction sheet as given.   Your physician has recommended that you wear a Zio monitor. (To be worn for 14 days).This monitor is a medical device that records the heart's electrical activity. Doctors most often use these monitors to diagnose arrhythmias. Arrhythmias are problems with the speed or rhythm of the heartbeat. The monitor is a small device applied to your chest. You can wear one while you do your normal daily activities. While wearing this monitor if you have any symptoms to push the button and record what you felt. Once you have worn this monitor for the period of time provider prescribed (Usually 14 days), you will return the monitor device in the postage paid box. Once it is returned they will download the data collected and  provide [place] with a report which the provider will then review and we will call you with those results. Important tips:  1. Avoid showering during the first 24 hours of wearing the monitor. 2. Avoid excessive sweating to help maximize wear time. 3. Do not submerge the device, no hot tubs, and no swimming pools. 4. Keep any lotions or oils away from the patch. 5. After 24 hours you may shower with the patch on. Take brief showers with your back facing the shower head.  6. Do not remove patch once it has been placed because that will interrupt data and decrease adhesive wear time. 7. Push the button when you have any symptoms and write down what you were feeling. 8. Once you have completed wearing your monitor, remove and place into box which has postage paid and place in your outgoing mailbox.  9. If for some reason you have misplaced your box then call our office and we can provide another box and/or mail it off for you.        Follow-Up: At [facility], you and your health needs are our priority.  As part of our continuing mission to provide you with exceptional heart care, we have created designated Provider Care Teams.  These Care Teams include your primary Cardiologist (physician) and Advanced Practice Providers (APPs -  Physician Assistants and Nurse Practitioners) who all work together to provide you with the care you need, when you need it.  We recommend signing up for the patient  portal called "MyChart".  Sign up information is provided on this After Visit Summary.  MyChart is used to connect with patients for Virtual Visits (Telemedicine).  Patients are able to view lab/test results, encounter notes, upcoming appointments, etc.  Non-urgent messages can be sent to your provider as well.   To learn more about what you can do with MyChart, go to [email or web address].    Your next appointment:   6 week(s)  The format for your next appointment:   In Person  Provider:   You may  see  Nelva Bush, MD or one of the following Advanced Practice Providers on your designated Care Team:    Murray Hodgkins, NP  Christell Faith, PA-C  Marrianne Mood, PA-C  Cadence Kathlen Mody, [place]  Laurann Montana, NP    Other Instructions Your cardiac CT will be scheduled at one of the below locations:   [facility] [address] [place], [place] [address] [phone]  [place] [address] [place], [place] [address] [phone]  If scheduled at [facility], please arrive at the [facility] main entrance (entrance A) of [facility] 30 minutes prior to test start time. Proceed to the [facility] Radiology Department (first floor) to check-in and test prep.  If scheduled at [facility], please arrive 15 mins early for check-in and test prep.  Please follow these instructions carefully (unless otherwise directed):    On the Night Before the Test: . Be sure to Drink plenty of water. . Do not consume any caffeinated/decaffeinated beverages or chocolate 12 hours prior to your test. . Do not take any antihistamines 12 hours prior to your test. .   On the Day of the Test: . Drink plenty of water until 1 hour prior to the test. . Do not eat any food 4 hours prior to the test. . You may take your regular medications prior to the test.  . Take metoprolol (Lopressor) two hours prior to test. . HOLD Hydrochlorothiazide morning of the test. . FEMALES- please wear underwire-free bra if available   *      After the Test: . Drink plenty of water. . After receiving IV contrast, you may experience a mild flushed feeling. This is normal. . On occasion, you may experience a mild rash up to 24 hours after the test. This is not dangerous. If this occurs, you can take Benadryl 25 mg and increase your fluid intake. . If you experience trouble breathing, this can be serious.  If it is severe call 911 IMMEDIATELY. If it is mild, please call our office. . If you take any of these medications: Glipizide/Metformin, Avandament, Glucavance, please do not take 48 hours after completing test unless otherwise instructed.   Once we have confirmed authorization from your insurance company, we will call you to set up a date and time for your test. Based on how quickly your insurance processes prior authorizations requests, please allow up to 4 weeks to be contacted for scheduling your Cardiac CT appointment. Be advised that routine Cardiac CT appointments could be scheduled as many as 8 weeks after your provider has ordered it.  For non-scheduling related questions, please contact the cardiac imaging nurse navigator should you have any questions/concerns: Marchia Bond, Cardiac Imaging Nurse Navigator Gordy Clement, Cardiac Imaging Nurse Navigator [facility] Direct Office Dial: [phone]   For scheduling needs, including cancellations and rescheduling, please call [place],  [phone].    

## 2020-07-14 ENCOUNTER — Telehealth (HOSPITAL_COMMUNITY): Payer: Self-pay | Admitting: *Deleted

## 2020-07-14 ENCOUNTER — Other Ambulatory Visit (HOSPITAL_COMMUNITY): Payer: Self-pay | Admitting: *Deleted

## 2020-07-14 DIAGNOSIS — R079 Chest pain, unspecified: Secondary | ICD-10-CM

## 2020-07-14 NOTE — Telephone Encounter (Signed)
Reaching out to patient to offer assistance regarding upcoming cardiac imaging study; pt verbalizes understanding of appt date/time, parking situation and where to check in, pre-test NPO status and medications ordered, and verified current allergies; name and call back number provided for further questions should they arise  Gordy Clement RN [facility] [phone] office [phone] cell  Pt aware to get blood work and to take 100mg  metoprolol tartrate 2 hours prior to scan.

## 2020-07-16 ENCOUNTER — Other Ambulatory Visit: Payer: Self-pay

## 2020-07-16 ENCOUNTER — Ambulatory Visit
Admission: RE | Admit: 2020-07-16 | Discharge: 2020-07-16 | Disposition: A | Discharge status: Home / Self Care | Payer: BC Managed Care – PPO | Source: Ambulatory Visit | Attending: Internal Medicine | Admitting: Internal Medicine

## 2020-07-16 DIAGNOSIS — R079 Chest pain, unspecified: Secondary | ICD-10-CM | POA: Insufficient documentation

## 2020-07-16 LAB — POCT I-STAT CREATININE: Creatinine, Ser: 0.8 mg/dL (ref 0.44–1.00)

## 2020-07-16 MED ORDER — NITROGLYCERIN 0.4 MG SL SUBL
0.8000 mg | SUBLINGUAL_TABLET | Freq: Once | SUBLINGUAL | Status: AC
  Administered 2020-07-16: 0.8 mg via SUBLINGUAL

## 2020-07-16 MED ORDER — IOHEXOL 350 MG/ML SOLN
75.0000 mL | Freq: Once | INTRAVENOUS | Status: AC | PRN
  Administered 2020-07-16: 75 mL via INTRAVENOUS

## 2020-07-16 NOTE — Progress Notes (Signed)
Patient tolerated CT well. Drank water after. Vital signs stable encourage to drink water throughout day.Reasons explained and verbalized understanding. Ambulated steady gait.  

## 2020-07-20 ENCOUNTER — Telehealth: Payer: Self-pay | Admitting: Internal Medicine

## 2020-07-20 NOTE — Telephone Encounter (Signed)
Nurse attempted to call patient in a duplicate encounter.

## 2020-07-20 NOTE — Telephone Encounter (Addendum)
Called to give the patient Cardiac CTA and zio monitor results. Lmtcb.  End, Harrell Gave, MD  P Cv Div [place] Triage -- Please let Courtney Mccarty know that her coronary CTA does not show any blockages in her heart arteries, which is great news. Some fat deposition in her liver was noted, which is nonspecific. She should discuss this further with her PCP to determine if additional liver testing is necessary. From a heart standpoint, I suggest that she continue her current medications and follow-up at her convenience to reassess her symptoms.    --Please let Courtney Mccarty know that her event monitor showed rare extra beats but no significant arrhythmia to explain her symptoms. Given her reassuring testing, she can follow-up on an as needed basis if she continues to have symptoms or other concerns.

## 2020-07-20 NOTE — Telephone Encounter (Signed)
This RN attempted to call patient, pt did not answer.

## 2020-07-20 NOTE — Telephone Encounter (Signed)
Patient is calling for CT results.

## 2020-07-21 NOTE — Telephone Encounter (Signed)
2nd attempt to call patient results.  No answer. Left message to call back on home and mobile number.

## 2020-07-21 NOTE — Telephone Encounter (Signed)
The patient has been notified of the results of monitor and coronary ct and verbalized understanding.  All questions (if any) were answered. Eliberto Ivory Zanetta Dehaan, RN 07/21/2020 12:40 PM

## 2020-07-23 ENCOUNTER — Other Ambulatory Visit: Payer: Self-pay

## 2020-07-23 ENCOUNTER — Ambulatory Visit (INDEPENDENT_AMBULATORY_CARE_PROVIDER_SITE_OTHER): Payer: BC Managed Care – PPO | Admitting: Obstetrics and Gynecology

## 2020-07-23 ENCOUNTER — Encounter: Payer: Self-pay | Admitting: Obstetrics and Gynecology

## 2020-07-23 VITALS — BP 136/86 | HR 67 | Ht 61.0 in | Wt 165.7 lb

## 2020-07-23 DIAGNOSIS — Z711 Person with feared health complaint in whom no diagnosis is made: Secondary | ICD-10-CM

## 2020-07-23 NOTE — Progress Notes (Signed)
HPI:      [name] is a 60 y.o. No obstetric history on file. who LMP was Patient's last menstrual period was 04/05/2015 (approximate).  Subjective:   She presents today her main concern being that she is not getting appropriate care since leaving [place].  She was seen at [facility] last month and an exam and Pap smear was performed.  This revealed ASCUS but negative HPV. 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  She states that she needs ultrasounds 2 times per year based on this finding.  She says she has not had these in more than 6 years. She does not describe a family history of breast or ovarian cancer. She states that she is menopausal and not having any bleeding.    Hx: The following portions of the patient's history were reviewed and updated as appropriate:             She  has a past medical history of Essential hypertension (10/11/2016), Hyperlipidemia, and Type 2 diabetes mellitus ([place]). She does not have any pertinent problems on file. She  has a past surgical history that includes No past surgeries; Colonoscopy with propofol (N/A, 11/28/2016); and polypectomy (N/A, 11/28/2016). Her family history includes Cancer in her maternal grandfather and paternal grandfather; Depression in her mother; Diabetes in her father, maternal grandmother, mother, and paternal grandmother; Heart attack (age of onset: 30) in her mother; Hypertension in her father and mother. She  reports that she has never smoked. She has never used smokeless tobacco. She reports current alcohol use of about 12.0 standard drinks of alcohol per week. She reports that she does not use drugs. She has a current medication list which includes the following prescription(s): cholecalciferol, elderberry, hydrochlorothiazide, metformin, fish oil, zinc sulfate, and metoprolol tartrate. She has No Known Allergies.       Review of  Systems:  Review of Systems  Constitutional: Denied constitutional symptoms, night sweats, recent illness, fatigue, fever, insomnia and weight loss.  Eyes: Denied eye symptoms, eye pain, photophobia, vision change and visual disturbance.  Ears/Nose/Throat/Neck: Denied ear, nose, throat or neck symptoms, hearing loss, nasal discharge, sinus congestion and sore throat.  Cardiovascular: Denied cardiovascular symptoms, arrhythmia, chest pain/pressure, edema, exercise intolerance, orthopnea and palpitations.  Respiratory: Denied pulmonary symptoms, asthma, pleuritic pain, productive sputum, cough, dyspnea and wheezing.  Gastrointestinal: Denied, gastro-esophageal reflux, melena, nausea and vomiting.  Genitourinary: Denied genitourinary symptoms including symptomatic vaginal discharge, pelvic relaxation issues, and urinary complaints.  Musculoskeletal: Denied musculoskeletal symptoms, stiffness, swelling, muscle weakness and myalgia.  Dermatologic: Denied dermatology symptoms, rash and scar.  Neurologic: Denied neurology symptoms, dizziness, headache, neck pain and syncope.  Psychiatric: Denied psychiatric symptoms, anxiety and depression.  Endocrine: Denied endocrine symptoms including hot flashes and night sweats.   Meds:   Current Outpatient Medications on File Prior to Visit  Medication Sig Dispense Refill  . cholecalciferol (VITAMIN D3) 25 MCG (1000 UNIT) tablet Take 1,000 Units by mouth daily.    Marland Kitchen ELDERBERRY PO Take by mouth.    . hydrochlorothiazide (HYDRODIURIL) 12.5 MG tablet Take 1 tablet by mouth once daily 90 tablet 0  . metFORMIN (GLUCOPHAGE) 500 MG tablet Take 1 tablet (500 mg total) by mouth 2 (two) times daily with a meal. 180 tablet 3  . Omega-3 Fatty Acids (FISH OIL) 1000 MG CAPS Take by mouth.    . Zinc Sulfate (ZINC 15  PO) Take by mouth.    . metoprolol tartrate (LOPRESSOR) 100 MG tablet Take 1 tablet (100mg ) 2 hours prior to your Cardiac CT (Patient not taking: Reported on  07/23/2020) 1 tablet 0   No current facility-administered medications on file prior to visit.          Objective:     Vitals:   07/23/20 0737  BP: 136/86  Pulse: 67   Filed Weights   07/23/20 0737  Weight: 165 lb 11.2 oz (75.2 kg)                Assessment:    No obstetric history on file. Patient Active Problem List   Diagnosis Date Noted  . Chest pain 06/25/2020  . Palpitations 06/25/2020  . Hyperlipidemia associated with type 2 diabetes mellitus ([place]) 06/25/2020  . Type 2 diabetes mellitus with hyperglycemia, without long-term current use of insulin ([place]) 04/27/2020  . Hypertriglyceridemia 04/27/2020  . Elevated liver enzymes 04/27/2020  . Annual physical exam 04/22/2020  . Hypercalcemia 04/22/2020  . Encounter to establish care with new doctor 04/22/2020  . Prediabetes 04/22/2020  . Screening examination for sexually transmitted disease 04/22/2020  . Screening for osteoporosis 04/22/2020  . Encounter for screening mammogram for malignant neoplasm of breast 04/22/2020  . Physical exam, pre-employment 09/16/2019  . Pre-diabetes 05/21/2019  . Personal history of colonic polyps   . Benign neoplasm of cecum   . Polyp of sigmoid colon   . Essential hypertension 10/11/2016  . 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 Concern about cancer without diagnosis    I have attempted to reassure her regarding her Pap smear revealing ASCUS but negative HPV.  We have discussed this in detail. I discussed possible genetic testing which was not available 25 years ago as the patient describes.  Plan:            1.  Recommend record release for patient's [place] records.  If any further follow-up is warranted based on these records we will contact her.  2.  I have discussed abnormal Pap smears and genetic testing in detail with the patient and I believe she now has a better understanding. Orders No orders of the defined types were placed in this encounter.   No orders of the  defined types were placed in this encounter.     F/U  No follow-ups on file. I spent 31 minutes involved in the care of this patient preparing to see the patient by obtaining and reviewing her medical history (including labs, imaging tests and prior procedures), documenting clinical information in the electronic health record (EHR), counseling and coordinating care plans, writing and sending prescriptions, ordering tests or procedures and directly communicating with the patient by discussing pertinent items from her history and physical exam as well as detailing my assessment and plan as noted above so that she has an informed understanding.  All of her questions were answered.  Finis Bud, M.D. 07/23/2020 8:10 AM

## 2020-07-30 ENCOUNTER — Other Ambulatory Visit: Payer: Self-pay | Admitting: Family Medicine

## 2020-07-30 DIAGNOSIS — I10 Essential (primary) hypertension: Secondary | ICD-10-CM

## 2020-08-04 ENCOUNTER — Other Ambulatory Visit: Payer: Self-pay

## 2020-08-04 ENCOUNTER — Ambulatory Visit (INDEPENDENT_AMBULATORY_CARE_PROVIDER_SITE_OTHER): Payer: BC Managed Care – PPO | Admitting: Family Medicine

## 2020-08-04 ENCOUNTER — Encounter: Payer: Self-pay | Admitting: Family Medicine

## 2020-08-04 VITALS — BP 106/70 | HR 73 | Ht 61.0 in | Wt 161.8 lb

## 2020-08-04 DIAGNOSIS — E1165 Type 2 diabetes mellitus with hyperglycemia: Secondary | ICD-10-CM

## 2020-08-04 LAB — POCT UA - MICROALBUMIN: Microalbumin Ur, POC: 20 mg/L

## 2020-08-04 NOTE — Patient Instructions (Addendum)
Thank you for coming to the office today.   Recent Labs    04/22/20 0832  HGBA1C 9.3*    Your provider would like to you have your annual eye exam. Please contact your current eye doctor or here are some good options for you to contact.   [facility]   Address: [address] [place], [place] [address] Phone: [phone]  Website: [email or web address] [address] [place], [place] [address] Phone: [phone] [email or web address]  [facility]  Address: [place], [place], [place] [address] Phone: [phone]   [facility] [address] [place], [place] [address] Phone: [phone]  [facility] Address: [place], [place], [place] [address]  Phone: [phone]  Diet Recommendations for Diabetes   Reduce Starchy (carb) foods include: Bread, rice, pasta, potatoes, corn, crackers, bagels, muffins, all baked goods.   Protein foods include: Meat, fish, poultry, eggs, dairy foods, and beans such as pinto and kidney beans (beans also provide carbohydrate).   1. Eat at least 3 meals and 1-2 snacks per day. Never go more than 4-5 hours while awake without eating.   2. Limit starchy foods to TWO per meal and ONE per snack. ONE portion of a starchy  food is equal to the following:   - ONE slice of bread (or its equivalent, such as half of a hamburger bun).   - 1/2 cup of a "scoopable" starchy food such as potatoes or rice.   - 1 OUNCE (28 grams) of starchy snacks (crackers or pretzels, look on label).   - 15 grams of carbohydrate as shown on food label.   3. Both lunch and dinner should include a protein food, a carb food, and vegetables.   - Obtain twice as many veg's as protein or carbohydrate foods for both lunch and dinner.   - Try to keep frozen veg's on hand for a quick vegetable serving.     - Fresh or frozen veg's are best.   4. Breakfast should always include protein.      Please schedule a Follow-up  Appointment to: Return in about 3 months (around 11/04/2020) for 3 month DM A1c w/ Rollene Fare new PCP, will need to discuss further options Statin, ACEi/ARB..  If you have any other questions or concerns, please feel free to call the office or send a message through [place]. You may also schedule an earlier appointment if necessary.  Additionally, you may be receiving a survey about your experience at our office within a few days to 1 week by e-mail or mail. We value your feedback.  Nobie Putnam, DO [place]

## 2020-08-04 NOTE — Progress Notes (Signed)
Subjective:    Patient ID: Courtney Mccarty, female    DOB: 1960/08/16, 60 y.o.   MRN: [id number]  Courtney Mccarty is a 60 y.o. female presenting on 08/04/2020 for Diabetes   HPI   CHRONIC DM, Type 2: Reports new problem onset since 04/2020, previous PCP Cyndia Skeeters, FNP, she was diagnosed with Type 2 Diabetes, treated with Metformin. Last A1c 9.3 (04/22/20)  W/ diagnosis Not checking CBG Meds: Metformin 500mg  BID Reports good compliance. Tolerating well w/o side-effects Currently not on ACEi/ARB Declines Statin therapy previously Lifestyle: - Diet (improving low carb low sugar diet, reducing alcohol intake)  - Exercise (walking and more exercise) Denies hypoglycemia, polyuria, visual changes, numbness or tingling.   Depression screen [facility] 2/9 05/15/2019 05/01/2018 05/01/2018  Decreased Interest 0 0 0  Down, Depressed, Hopeless 0 0 0  PHQ - 2 Score 0 0 0  Altered sleeping - 0 -  Tired, decreased energy - 0 -  Change in appetite - 0 -  Feeling bad or failure about yourself  - 0 -  Trouble concentrating - 0 -  Moving slowly or fidgety/restless - 0 -  Suicidal thoughts - 0 -  PHQ-9 Score - 0 -    Social History   Tobacco Use  . Smoking status: Never Smoker  . Smokeless tobacco: Never Used  Vaping Use  . Vaping Use: Never used  Substance Use Topics  . Alcohol use: Yes    Alcohol/week: 12.0 standard drinks    Types: 12 Cans of beer per week  . Drug use: No    Review of Systems Per HPI unless specifically indicated above     Objective:    BP 106/70   Pulse 73   Ht 5\' 1"  (1.549 m)   Wt 161 lb 12.8 oz (73.4 kg)   LMP 04/05/2015 (Approximate)   SpO2 99%   BMI 30.57 kg/m   Wt Readings from Last 3 Encounters:  08/04/20 161 lb 12.8 oz (73.4 kg)  07/23/20 165 lb 11.2 oz (75.2 kg)  06/25/20 164 lb 4 oz (74.5 kg)    Physical Exam Vitals and nursing note reviewed.  Constitutional:      General: She is not in acute distress.    Appearance: She is well-developed. She is  not diaphoretic.     Comments: Well-appearing, comfortable, cooperative  HENT:     Head: Normocephalic and atraumatic.  Eyes:     General:        Right eye: No discharge.        Left eye: No discharge.     Conjunctiva/sclera: Conjunctivae normal.  Cardiovascular:     Rate and Rhythm: Normal rate.  Pulmonary:     Effort: Pulmonary effort is normal.  Skin:    General: Skin is warm and dry.     Findings: No erythema or rash.  Neurological:     Mental Status: She is alert and oriented to person, place, and time.  Psychiatric:        Behavior: Behavior normal.     Comments: Well groomed, good eye contact, normal speech and thoughts      Diabetic Foot Exam - Simple   Simple Foot Form Diabetic Foot exam was performed with the following findings: Yes 08/04/2020  9:15 AM  Visual Inspection No deformities, no ulcerations, no other skin breakdown bilaterally: Yes Sensation Testing Intact to touch and monofilament testing bilaterally: Yes Pulse Check Posterior Tibialis and Dorsalis pulse intact bilaterally: Yes Comments     Results  for orders placed or performed in visit on 08/04/20  POCT UA - Microalbumin  Result Value Ref Range   Microalbumin Ur, POC 20 mg/L   Creatinine, POC     Albumin/Creatinine Ratio, Urine, POC        Assessment & Plan:   Problem List Items Addressed This Visit    Type 2 diabetes mellitus with hyperglycemia, without long-term current use of insulin (HCC) - Primary   Relevant Orders   POCT UA - Microalbumin (Completed)   Hemoglobin A1c      Uncontrolled DM with hyperglycemia previously Now on metformin, pending lab Z5G today Complications - hyperlipidemia, obesity  Plan:  1. Continue current therapy - Metformin 500mg  BID - pending result of A1c would adjust / increase dose, also in future may reconsider options w/ GLP1, SGLT2 if A1c still above goal >8-9 2. Encourage improved lifestyle - low carb, low sugar diet, reduce portion size, continue  improving regular exercise 3.  Check CBG, bring log to next visit for review 4. Discussion today on will be indicated for Statin, ACEi/ARB in future, she declines new medication today, since new diagnosis 04/2020 she is adjusting to metformin, advised that when ready she can pursue these options. She is hopeful to be on minimal medicine, but again advised can use these for protecting kidney and for cholesterol 5. DM Foot exam done today / Advised to schedule DM ophtho exam, send record 6. Urine Microalbumin today, result 20    No orders of the defined types were placed in this encounter.     Follow up plan: Return in about 3 months (around 11/04/2020) for 3 month DM A1c w/ Rollene Fare new PCP, will need to discuss further options Statin, ACEi/ARB.Marland Kitchen    Nobie Putnam, [facility] 08/04/2020, 9:06 AM

## 2020-08-05 LAB — HEMOGLOBIN A1C
Hgb A1c MFr Bld: 7.4 % of total Hgb — ABNORMAL HIGH (ref <5.7)
Mean Plasma Glucose: 166 mg/dL
eAG (mmol/L): 9.2 mmol/L

## 2020-08-12 ENCOUNTER — Telehealth: Payer: Self-pay

## 2020-08-12 NOTE — Telephone Encounter (Signed)
Copied from [address] [id number]. Topic: General - Inquiry >> Aug 11, 2020  3:01 PM Greggory Keen D wrote: Reason for CRM: Pt called asking for her lab result.s   She said she had Labs done a week or so and has not back.  CB#  [phone]  I left another message on the patient vm with recommendations.

## 2020-12-30 ENCOUNTER — Other Ambulatory Visit: Payer: Self-pay | Admitting: Family Medicine

## 2020-12-30 DIAGNOSIS — I10 Essential (primary) hypertension: Secondary | ICD-10-CM

## 2021-01-13 ENCOUNTER — Ambulatory Visit: Payer: BC Managed Care – PPO | Admitting: Internal Medicine

## 2021-01-14 ENCOUNTER — Encounter: Payer: Self-pay | Admitting: Internal Medicine

## 2021-01-14 ENCOUNTER — Ambulatory Visit: Payer: BC Managed Care – PPO | Admitting: Internal Medicine

## 2021-01-14 ENCOUNTER — Other Ambulatory Visit: Payer: Self-pay

## 2021-01-14 VITALS — BP 122/66 | HR 61 | Temp 97.1°F | Resp 18 | Ht 61.0 in | Wt 164.0 lb

## 2021-01-14 DIAGNOSIS — K760 Fatty (change of) liver, not elsewhere classified: Secondary | ICD-10-CM

## 2021-01-14 DIAGNOSIS — I10 Essential (primary) hypertension: Secondary | ICD-10-CM

## 2021-01-14 DIAGNOSIS — R87610 Atypical squamous cells of undetermined significance on cytologic smear of cervix (ASC-US): Secondary | ICD-10-CM

## 2021-01-14 DIAGNOSIS — E1165 Type 2 diabetes mellitus with hyperglycemia: Secondary | ICD-10-CM | POA: Diagnosis not present

## 2021-01-14 DIAGNOSIS — Z114 Encounter for screening for human immunodeficiency virus [HIV]: Secondary | ICD-10-CM

## 2021-01-14 DIAGNOSIS — E6609 Other obesity due to excess calories: Secondary | ICD-10-CM | POA: Insufficient documentation

## 2021-01-14 DIAGNOSIS — E1169 Type 2 diabetes mellitus with other specified complication: Secondary | ICD-10-CM

## 2021-01-14 DIAGNOSIS — E785 Hyperlipidemia, unspecified: Secondary | ICD-10-CM

## 2021-01-14 DIAGNOSIS — E663 Overweight: Secondary | ICD-10-CM | POA: Insufficient documentation

## 2021-01-14 DIAGNOSIS — Z6830 Body mass index (BMI) 30.0-30.9, adult: Secondary | ICD-10-CM

## 2021-01-14 DIAGNOSIS — R102 Pelvic and perineal pain unspecified side: Secondary | ICD-10-CM

## 2021-01-14 DIAGNOSIS — E66811 Obesity, class 1: Secondary | ICD-10-CM

## 2021-01-14 NOTE — Assessment & Plan Note (Signed)
A1c and urine microalbumin today Encouraged her to consume a low-carb diet and exercise for weight loss She wants to stop Metformin but advised her against this She has an appointment for an eye exam, advised her to have them send me a copy of this Encourage routine foot exams She declines flu, Pneumovax or COVID

## 2021-01-14 NOTE — Patient Instructions (Signed)
Carbohydrate Counting for Diabetes Mellitus, Adult Carbohydrate counting is a method of keeping track of how many carbohydrates you eat. Eating carbohydrates naturally increases the amount of sugar (glucose) in the blood. Counting how many carbohydrates you eat improves your blood glucose control, which helps you manage your diabetes. It is important to know how many carbohydrates you can safely have in each meal. This is different for every person. A dietitian can help you make a meal plan and calculate how many carbohydrates you should have at each meal and snack. What foods contain carbohydrates? Carbohydrates are found in the following foods: Grains, such as breads and cereals. Dried beans and soy products. Starchy vegetables, such as potatoes, peas, and corn. Fruit and fruit juices. Milk and yogurt. Sweets and snack foods, such as cake, cookies, candy, chips, and soft drinks. How do I count carbohydrates in foods? There are two ways to count carbohydrates in food. You can read food labels or learn standard serving sizes of foods. You can use either of the methods or a combination of both. Using the Nutrition Facts label The Nutrition Facts list is included on the labels of almost all packaged foods and beverages in the [place] It includes: The serving size. Information about nutrients in each serving, including the grams (g) of carbohydrate per serving. To use the Nutrition Facts: Decide how many servings you will have. Multiply the number of servings by the number of carbohydrates per serving. The resulting number is the total amount of carbohydrates that you will be having. Learning the standard serving sizes of foods When you eat carbohydrate foods that are not packaged or do not include Nutrition Facts on the label, you need to measure the servings in order to count the amount of carbohydrates. Measure the foods that you will eat with a food scale or measuring cup, if needed. Decide how  many standard-size servings you will eat. Multiply the number of servings by 15. For foods that contain carbohydrates, one serving equals 15 g of carbohydrates. For example, if you eat 2 cups or 10 oz (300 g) of strawberries, you will have eaten 2 servings and 30 g of carbohydrates (2 servings x 15 g = 30 g). For foods that have more than one food mixed, such as soups and casseroles, you must count the carbohydrates in each food that is included. The following list contains standard serving sizes of common carbohydrate-rich foods. Each of these servings has about 15 g of carbohydrates: 1 slice of bread. 1 six-inch (15 cm) tortilla. ? cup or 2 oz (53 g) cooked rice or pasta.  cup or 3 oz (85 g) cooked or canned, drained and rinsed beans or lentils.  cup or 3 oz (85 g) starchy vegetable, such as peas, corn, or squash.  cup or 4 oz (120 g) hot cereal.  cup or 3 oz (85 g) boiled or mashed potatoes, or  or 3 oz (85 g) of a large baked potato.  cup or 4 fl oz (118 mL) fruit juice. 1 cup or 8 fl oz (237 mL) milk. 1 small or 4 oz (106 g) apple.  or 2 oz (63 g) of a medium banana. 1 cup or 5 oz (150 g) strawberries. 3 cups or 1 oz (24 g) popped popcorn. What is an example of carbohydrate counting? To calculate the number of carbohydrates in this sample meal, follow the steps shown below. Sample meal 3 oz (85 g) chicken breast. ? cup or 4 oz (106 g) brown   rice.  cup or 3 oz (85 g) corn. 1 cup or 8 fl oz (237 mL) milk. 1 cup or 5 oz (150 g) strawberries with sugar-free whipped topping. Carbohydrate calculation Identify the foods that contain carbohydrates: Rice. Corn. Milk. Strawberries. Calculate how many servings you have of each food: 2 servings rice. 1 serving corn. 1 serving milk. 1 serving strawberries. Multiply each number of servings by 15 g: 2 servings rice x 15 g = 30 g. 1 serving corn x 15 g = 15 g. 1 serving milk x 15 g = 15 g. 1 serving strawberries x 15 g = 15  g. Add together all of the amounts to find the total grams of carbohydrates eaten: 30 g + 15 g + 15 g + 15 g = 75 g of carbohydrates total. What are tips for following this plan? Shopping Develop a meal plan and then make a shopping list. Buy fresh and frozen vegetables, fresh and frozen fruit, dairy, eggs, beans, lentils, and whole grains. Look at food labels. Choose foods that have more fiber and less sugar. Avoid processed foods and foods with added sugars. Meal planning Aim to have the same amount of carbohydrates at each meal and for each snack time. Plan to have regular, balanced meals and snacks. Where to find more information American Diabetes Association: www.diabetes.org Centers for Disease Control and Prevention: www.cdc.gov Summary Carbohydrate counting is a method of keeping track of how many carbohydrates you eat. Eating carbohydrates naturally increases the amount of sugar (glucose) in the blood. Counting how many carbohydrates you eat improves your blood glucose control, which helps you manage your diabetes. A dietitian can help you make a meal plan and calculate how many carbohydrates you should have at each meal and snack. This information is not intended to replace advice given to you by your health care provider. Make sure you discuss any questions you have with your health care provider. Document Revised: 03/21/2019 Document Reviewed: 03/22/2019 Elsevier Patient Education  2021 [employer]vier Inc.  

## 2021-01-14 NOTE — Progress Notes (Signed)
Subjective:    Patient ID: Courtney Mccarty, female    DOB: April 09, 1960, 60 y.o.   MRN: [id number]  HPI  Patient presents the clinic today for follow-up of chronic conditions.  She is establishing care with me today, transferring care from Lonie Peak, NP.  HTN: Her BP today is 122/66.  She is taking HCTZ as prescribed.  ECG from 06/2020 reviewed.  HLD: Her last LDL was not calculated, triglycerides 704, 04/2020.  She is taking Fish Oil OTC.  She tries to consume a low-fat diet.  DM2: Her last A1c was 7.4%, 08/2020.  She is taking Metformin as prescribed but reports she does not want to take this any longer.  She does not check her sugars.  She checks her feet routinely.  Her last eye exam is scheduled 02/02/21.  Flu never.  Pneumovax never.  COVID never.  Fatty Liver: Her last AST/ALT was 95/97, 04/2020.  She also reports abdominal pain.  This started 2 weeks ago.  The pain is intermittent.  She describes the pain as achy.  The pain does not radiate.  She denies nausea, vomiting, reflux, constipation, diarrhea or blood in her stool. She denies urinary or vaginal symptoms.  She has taken Tylenol OTC with some relief of symptoms.   She is also requesting a referral for transvaginal ultrasound.  She reports she has a history of precancerous cells.  She reports when she lived in [place] she had to have it done twice a year. Her last pap showed ASCUS, 06/2020. She is no longer having periods.  Review of Systems     Past Medical History:  Diagnosis Date   Essential hypertension 10/11/2016   Hyperlipidemia    Type 2 diabetes mellitus (HCC)     Current Outpatient Medications  Medication Sig Dispense Refill   cholecalciferol (VITAMIN D3) 25 MCG (1000 UNIT) tablet Take 1,000 Units by mouth daily.     ELDERBERRY PO Take by mouth.     hydrochlorothiazide (HYDRODIURIL) 12.5 MG tablet Take 1 tablet by mouth once daily 90 tablet 0   metFORMIN (GLUCOPHAGE) 500 MG tablet Take 1 tablet (500 mg total) by  mouth 2 (two) times daily with a meal. 180 tablet 3   metoprolol tartrate (LOPRESSOR) 100 MG tablet Take 1 tablet (100mg ) 2 hours prior to your Cardiac CT (Patient not taking: No sig reported) 1 tablet 0   Omega-3 Fatty Acids (FISH OIL) 1000 MG CAPS Take by mouth.     Zinc Sulfate (ZINC 15 PO) Take by mouth.     No current facility-administered medications for this visit.    No Known Allergies  Family History  Problem Relation Age of Onset   Diabetes Mother    Hypertension Mother    Depression Mother    Heart attack Mother 29   Diabetes Father    Hypertension Father    Diabetes Maternal Grandmother    Cancer Maternal Grandfather    Diabetes Paternal Grandmother    Cancer Paternal Grandfather     Social History   Socioeconomic History   Marital status: Single    Spouse name: Not on file   Number of children: Not on file   Years of education: Not on file   Highest education level: Not on file  Occupational History   Not on file  Tobacco Use   Smoking status: Never   Smokeless tobacco: Never  Vaping Use   Vaping Use: Never used  Substance and Sexual Activity   Alcohol use:  Yes    Alcohol/week: 12.0 standard drinks    Types: 12 Cans of beer per week   Drug use: No   Sexual activity: Not Currently    Birth control/protection: None  Other Topics Concern   Not on file  Social History Narrative   Not on file   Social Determinants of Health   Financial Resource Strain: Not on file  Food Insecurity: Not on file  Transportation Needs: Not on file  Physical Activity: Not on file  Stress: Not on file  Social Connections: Not on file  Intimate Partner Violence: Not on file     Constitutional: Denies fever, malaise, fatigue, headache or abrupt weight changes.  HEENT: Denies eye pain, eye redness, ear pain, ringing in the ears, wax buildup, runny nose, nasal congestion, bloody nose, or sore throat. Respiratory: Denies difficulty breathing, shortness of breath, cough  or sputum production.   Cardiovascular: Denies chest pain, chest tightness, palpitations or swelling in the hands or feet.  Gastrointestinal: Pt reports pelvic pain. Denies bloating, constipation, diarrhea or blood in the stool.  GU: Denies urgency, frequency, pain with urination, burning sensation, blood in urine, odor or discharge. Musculoskeletal: Denies decrease in range of motion, difficulty with gait, muscle pain or joint pain and swelling.  Skin: Denies redness, rashes, lesions or ulcercations.  Neurological: Denies dizziness, difficulty with memory, difficulty with speech or problems with balance and coordination.  Psych: Denies anxiety, depression, SI/HI.  No other specific complaints in a complete review of systems (except as listed in HPI above).  Objective:   Physical Exam  BP 122/66 (BP Location: Right Arm, Patient Position: Sitting, Cuff Size: Normal)   Pulse 61   Temp (!) 97.1 F (36.2 C) (Temporal)   Resp 18   Ht 5\' 1"  (1.549 m)   Wt 164 lb (74.4 kg)   LMP 04/05/2015 (Approximate)   SpO2 99%   BMI 30.99 kg/m   Wt Readings from Last 3 Encounters:  08/04/20 161 lb 12.8 oz (73.4 kg)  07/23/20 165 lb 11.2 oz (75.2 kg)  06/25/20 164 lb 4 oz (74.5 kg)    General: Appears her stated age, obese, in NAD. Skin: Warm, dry and intact. No ulcerations noted. HEENT: Head: normal shape and size; Eyes: EOMs intact; Cardiovascular: Normal rate and rhythm. S1,S2 noted.  No murmur, rubs or gallops noted. No JVD or BLE edema. No carotid bruits noted. Pulmonary/Chest: Normal effort and positive vesicular breath sounds. No respiratory distress. No wheezes, rales or ronchi noted.  Abdomen: Soft and nontender. Normal bowel sounds. No distention or masses noted.  Musculoskeletal: No difficulty with gait.  Neurological: Alert and oriented.  Psychiatric: Mood and affect flat. Behavior is normal. Judgment and thought content normal.   BMET    Component Value Date/Time   NA 136  04/22/2020 0832   NA 140 12/07/2018 1009   K 3.9 04/22/2020 0832   CL 95 (L) 04/22/2020 0832   CO2 31 04/22/2020 0832   GLUCOSE 255 (H) 04/22/2020 0832   BUN 12 04/22/2020 0832   BUN 12 12/07/2018 1009   CREATININE 0.80 07/16/2020 1038   CREATININE 0.78 04/22/2020 0832   CALCIUM 10.4 04/22/2020 0832   CALCIUM 10.4 04/22/2020 0832   GFRNONAA 84 04/22/2020 0832   GFRAA 97 04/22/2020 0832    Lipid Panel     Component Value Date/Time   CHOL 159 04/22/2020 0832   TRIG 704 (H) 04/22/2020 0832   HDL 33 (L) 04/22/2020 0832   CHOLHDL 4.8 04/22/2020 9509  VLDL 58 (H) 11/08/2016 0947   LDLCALC  04/22/2020 4827     Comment:     . LDL cholesterol not calculated. Triglyceride levels greater than 400 mg/dL invalidate calculated LDL results. . Reference range: <100 . Desirable range <100 mg/dL for primary prevention;   <70 mg/dL for patients with CHD or diabetic patients  with > or = 2 CHD risk factors. Marland Kitchen LDL-C is now calculated using the Martin-Hopkins  calculation, which is a validated novel method providing  better accuracy than the Friedewald equation in the  estimation of LDL-C.  Cresenciano Genre et al. Annamaria Helling. 0786;754(49): 2061-2068  (http://education.[employer].com/faq/FAQ164)     CBC    Component Value Date/Time   WBC 4.3 04/22/2020 0832   RBC 5.11 (H) 04/22/2020 0832   HGB 15.3 04/22/2020 0832   HCT 44.3 04/22/2020 0832   PLT 210 04/22/2020 0832   MCV 86.7 04/22/2020 0832   MCH 29.9 04/22/2020 0832   MCHC 34.5 04/22/2020 0832   RDW 12.3 04/22/2020 0832   LYMPHSABS 1,939 04/22/2020 0832   MONOABS 360 11/08/2016 0947   EOSABS 30 04/22/2020 0832   BASOSABS 22 04/22/2020 0832    Hgb A1C Lab Results  Component Value Date   HGBA1C 7.4 (H) 08/04/2020           Assessment & Plan:   ASCUS, Pelvic Pain:  Ultrasound pelvic/transvaginal ordered We will plan to repeat Pap 06/2021  RTC in 3 months for your annual exam Webb Silversmith, NP This visit occurred  during the SARS-CoV-2 public health emergency.  Safety protocols were in place, including screening questions prior to the visit, additional usage of staff PPE, and extensive cleaning of exam room while observing appropriate contact time as indicated for disinfecting solutions.

## 2021-01-14 NOTE — Assessment & Plan Note (Signed)
Encourage diet and exercise for weight loss 

## 2021-01-14 NOTE — Assessment & Plan Note (Signed)
C-Met and lipid profile today Encouraged her to consume a low-fat diet and increase aerobic exercise Continue Fish Oil

## 2021-01-14 NOTE — Assessment & Plan Note (Signed)
C-Met today Encourage exercise for weight loss around the abdominal cavity

## 2021-01-14 NOTE — Assessment & Plan Note (Signed)
Controlled on HCTZ C-Met today Reinforced DASH diet and exercise for weight loss

## 2021-01-15 LAB — CBC
HCT: 41.5 % (ref 35.0–45.0)
Hemoglobin: 13.9 g/dL (ref 11.7–15.5)
MCH: 29.6 pg (ref 27.0–33.0)
MCHC: 33.5 g/dL (ref 32.0–36.0)
MCV: 88.3 fL (ref 80.0–100.0)
MPV: 10.3 fL (ref 7.5–12.5)
Platelets: 229 10*3/uL (ref 140–400)
RBC: 4.7 10*6/uL (ref 3.80–5.10)
RDW: 12.6 % (ref 11.0–15.0)
WBC: 6.5 10*3/uL (ref 3.8–10.8)

## 2021-01-15 LAB — COMPLETE METABOLIC PANEL WITH GFR
AG Ratio: 1.7 (calc) (ref 1.0–2.5)
ALT: 57 U/L — ABNORMAL HIGH (ref 6–29)
AST: 38 U/L — ABNORMAL HIGH (ref 10–35)
Albumin: 4.6 g/dL (ref 3.6–5.1)
Alkaline phosphatase (APISO): 77 U/L (ref 37–153)
BUN: 16 mg/dL (ref 7–25)
CO2: 28 mmol/L (ref 20–32)
Calcium: 10.3 mg/dL (ref 8.6–10.4)
Chloride: 99 mmol/L (ref 98–110)
Creat: 0.64 mg/dL (ref 0.50–1.03)
Globulin: 2.7 g/dL (calc) (ref 1.9–3.7)
Glucose, Bld: 155 mg/dL — ABNORMAL HIGH (ref 65–99)
Potassium: 4.4 mmol/L (ref 3.5–5.3)
Sodium: 139 mmol/L (ref 135–146)
Total Bilirubin: 0.3 mg/dL (ref 0.2–1.2)
Total Protein: 7.3 g/dL (ref 6.1–8.1)
eGFR: 102 mL/min/{1.73_m2} (ref >=60)

## 2021-01-15 LAB — LIPID PANEL
Cholesterol: 174 mg/dL (ref <200)
HDL: 39 mg/dL — ABNORMAL LOW (ref >=50)
LDL Cholesterol (Calc): 99 mg/dL (calc)
Non-HDL Cholesterol (Calc): 135 mg/dL (calc) — ABNORMAL HIGH (ref <130)
Total CHOL/HDL Ratio: 4.5 (calc) (ref <5.0)
Triglycerides: 249 mg/dL — ABNORMAL HIGH (ref <150)

## 2021-01-15 LAB — HEMOGLOBIN A1C
Hgb A1c MFr Bld: 7.4 % of total Hgb — ABNORMAL HIGH (ref <5.7)
Mean Plasma Glucose: 166 mg/dL
eAG (mmol/L): 9.2 mmol/L

## 2021-01-15 LAB — MICROALBUMIN / CREATININE URINE RATIO
Creatinine, Urine: 105 mg/dL (ref 20–275)
Microalb Creat Ratio: 23 mcg/mg creat (ref <30)
Microalb, Ur: 2.4 mg/dL

## 2021-01-15 LAB — HIV ANTIBODY (ROUTINE TESTING W REFLEX): HIV 1&2 Ab, 4th Generation: NONREACTIVE

## 2021-01-25 ENCOUNTER — Ambulatory Visit: Payer: BC Managed Care – PPO | Attending: Internal Medicine

## 2021-04-02 ENCOUNTER — Other Ambulatory Visit: Payer: Self-pay | Admitting: Internal Medicine

## 2021-04-02 DIAGNOSIS — I10 Essential (primary) hypertension: Secondary | ICD-10-CM

## 2021-04-02 NOTE — Telephone Encounter (Signed)
Requested Prescriptions  Pending Prescriptions Disp Refills   hydrochlorothiazide (HYDRODIURIL) 12.5 MG tablet [Pharmacy Med Name: hydroCHLOROthiazide 12.5 MG Oral Tablet] 90 tablet 0    Sig: Take 1 tablet by mouth once daily     Cardiovascular: Diuretics - Thiazide Passed - 04/02/2021  5:23 PM      Passed - Ca in normal range and within 360 days    Calcium  Date Value Ref Range Status  01/14/2021 10.3 8.6 - 10.4 mg/dL Final         Passed - Cr in normal range and within 360 days    Creat  Date Value Ref Range Status  01/14/2021 0.64 0.50 - 1.03 mg/dL Final   Creatinine, Urine  Date Value Ref Range Status  01/14/2021 105 20 - 275 mg/dL Final         Passed - K in normal range and within 360 days    Potassium  Date Value Ref Range Status  01/14/2021 4.4 3.5 - 5.3 mmol/L Final         Passed - Na in normal range and within 360 days    Sodium  Date Value Ref Range Status  01/14/2021 139 135 - 146 mmol/L Final  12/07/2018 140 134 - 144 mmol/L Final         Passed - Last BP in normal range    BP Readings from Last 1 Encounters:  01/14/21 122/66         Passed - Valid encounter within last 6 months    Recent Outpatient Visits          2 months ago Essential hypertension   [place], Coralie Keens, NP   8 months ago Type 2 diabetes mellitus with hyperglycemia, without long-term current use of insulin [facility])   [facility] Olin Hauser, DO   9 months ago [place], NP   11 months ago Type 2 diabetes mellitus with hyperglycemia, without long-term current use of insulin [facility])   [facility], Lupita Raider, FNP   11 months ago Annual physical exam   [facility], Lupita Raider, FNP      Future Appointments            In 2 months [place], Coralie Keens, NP [facility], [facility]

## 2021-05-05 ENCOUNTER — Encounter: Payer: Self-pay | Admitting: Internal Medicine

## 2021-05-05 ENCOUNTER — Ambulatory Visit (INDEPENDENT_AMBULATORY_CARE_PROVIDER_SITE_OTHER): Payer: BC Managed Care – PPO | Admitting: Internal Medicine

## 2021-05-05 ENCOUNTER — Other Ambulatory Visit: Payer: Self-pay

## 2021-05-05 VITALS — BP 133/67 | HR 84 | Temp 96.9°F | Ht 60.0 in | Wt 163.0 lb

## 2021-05-05 DIAGNOSIS — Z0001 Encounter for general adult medical examination with abnormal findings: Secondary | ICD-10-CM

## 2021-05-05 DIAGNOSIS — E1165 Type 2 diabetes mellitus with hyperglycemia: Secondary | ICD-10-CM | POA: Diagnosis not present

## 2021-05-05 DIAGNOSIS — Z1231 Encounter for screening mammogram for malignant neoplasm of breast: Secondary | ICD-10-CM | POA: Diagnosis not present

## 2021-05-05 DIAGNOSIS — Z780 Asymptomatic menopausal state: Secondary | ICD-10-CM

## 2021-05-05 NOTE — Patient Instructions (Signed)
Health Maintenance for Postmenopausal Women ?Menopause is a normal process in which your ability to get pregnant comes to an end. This process happens slowly over many months or years, usually between the ages of 48 and 55. Menopause is complete when you have missed your menstrual period for 12 months. ?It is important to talk with your health care provider about some of the most common conditions that affect women after menopause (postmenopausal women). These include heart disease, cancer, and bone loss (osteoporosis). Adopting a healthy lifestyle and getting preventive care can help to promote your health and wellness. The actions you take can also lower your chances of developing some of these common conditions. ?What are the signs and symptoms of menopause? ?During menopause, you may have the following symptoms: ?Hot flashes. These can be moderate or severe. ?Night sweats. ?Decrease in sex drive. ?Mood swings. ?Headaches. ?Tiredness (fatigue). ?Irritability. ?Memory problems. ?Problems falling asleep or staying asleep. ?Talk with your health care provider about treatment options for your symptoms. ?Do I need hormone replacement therapy? ?Hormone replacement therapy is effective in treating symptoms that are caused by menopause, such as hot flashes and night sweats. ?Hormone replacement carries certain risks, especially as you become older. If you are thinking about using estrogen or estrogen with progestin, discuss the benefits and risks with your health care provider. ?How can I reduce my risk for heart disease and stroke? ?The risk of heart disease, heart attack, and stroke increases as you age. One of the causes may be a change in the body's hormones during menopause. This can affect how your body uses dietary fats, triglycerides, and cholesterol. Heart attack and stroke are medical emergencies. There are many things that you can do to help prevent heart disease and stroke. ?Watch your blood pressure ?High  blood pressure causes heart disease and increases the risk of stroke. This is more likely to develop in people who have high blood pressure readings or are overweight. ?Have your blood pressure checked: ?Every 3-5 years if you are 18-39 years of age. ?Every year if you are 40 years old or older. ?Eat a healthy diet ? ?Eat a diet that includes plenty of vegetables, fruits, low-fat dairy products, and lean protein. ?Do not eat a lot of foods that are high in solid fats, added sugars, or sodium. ?Get regular exercise ?Get regular exercise. This is one of the most important things you can do for your health. Most adults should: ?Try to exercise for at least 150 minutes each week. The exercise should increase your heart rate and make you sweat (moderate-intensity exercise). ?Try to do strengthening exercises at least twice each week. Do these in addition to the moderate-intensity exercise. ?Spend less time sitting. Even light physical activity can be beneficial. ?Other tips ?Work with your health care provider to achieve or maintain a healthy weight. ?Do not use any products that contain nicotine or tobacco. These products include cigarettes, chewing tobacco, and vaping devices, such as e-cigarettes. If you need help quitting, ask your health care provider. ?Know your numbers. Ask your health care provider to check your cholesterol and your blood sugar (glucose). Continue to have your blood tested as directed by your health care provider. ?Do I need screening for cancer? ?Depending on your health history and family history, you may need to have cancer screenings at different stages of your life. This may include screening for: ?Breast cancer. ?Cervical cancer. ?Lung cancer. ?Colorectal cancer. ?What is my risk for osteoporosis? ?After menopause, you may be   at increased risk for osteoporosis. Osteoporosis is a condition in which bone destruction happens more quickly than new bone creation. To help prevent osteoporosis or  the bone fractures that can happen because of osteoporosis, you may take the following actions: ?If you are 19-50 years old, get at least 1,000 mg of calcium and at least 600 international units (IU) of vitamin D per day. ?If you are older than age 50 but younger than age 70, get at least 1,200 mg of calcium and at least 600 international units (IU) of vitamin D per day. ?If you are older than age 70, get at least 1,200 mg of calcium and at least 800 international units (IU) of vitamin D per day. ?Smoking and drinking excessive alcohol increase the risk of osteoporosis. Eat foods that are rich in calcium and vitamin D, and do weight-bearing exercises several times each week as directed by your health care provider. ?How does menopause affect my mental health? ?Depression may occur at any age, but it is more common as you become older. Common symptoms of depression include: ?Feeling depressed. ?Changes in sleep patterns. ?Changes in appetite or eating patterns. ?Feeling an overall lack of motivation or enjoyment of activities that you previously enjoyed. ?Frequent crying spells. ?Talk with your health care provider if you think that you are experiencing any of these symptoms. ?General instructions ?See your health care provider for regular wellness exams and vaccines. This may include: ?Scheduling regular health, dental, and eye exams. ?Getting and maintaining your vaccines. These include: ?Influenza vaccine. Get this vaccine each year before the flu season begins. ?Pneumonia vaccine. ?Shingles vaccine. ?Tetanus, diphtheria, and pertussis (Tdap) booster vaccine. ?Your health care provider may also recommend other immunizations. ?Tell your health care provider if you have ever been abused or do not feel safe at home. ?Summary ?Menopause is a normal process in which your ability to get pregnant comes to an end. ?This condition causes hot flashes, night sweats, decreased interest in sex, mood swings, headaches, or lack  of sleep. ?Treatment for this condition may include hormone replacement therapy. ?Take actions to keep yourself healthy, including exercising regularly, eating a healthy diet, watching your weight, and checking your blood pressure and blood sugar levels. ?Get screened for cancer and depression. Make sure that you are up to date with all your vaccines. ?This information is not intended to replace advice given to you by your health care provider. Make sure you discuss any questions you have with your health care provider. ?Document Revised: 08/10/2020 Document Reviewed: 08/10/2020 ?Elsevier Patient Education ? 2022 Elsevier Inc. ? ?

## 2021-05-05 NOTE — Progress Notes (Signed)
Subjective:    Patient ID: Courtney Mccarty, female    DOB: [birth year], 61 y.o.   MRN: [id number]  HPI  Patient presents to clinic today for her annual exam.  Flu: never Tetanus: 11/2016 COVID: never Pneumovax: never Shingrix: never Pap smear: 06/2020, abnormal Mammogram: > 2 years ago Bone density: > 2 years ago Colon screening: 11/2016 Vision screening: as needed Dentist: biannually  Diet: She does eat meat. She consumes fruits and veggies. She does eat some fried foods. She drinks mostly water. Exercise: None  Review of Systems     Past Medical History:  Diagnosis Date   Essential hypertension 10/11/2016   Hyperlipidemia    Type 2 diabetes mellitus (HCC)     Current Outpatient Medications  Medication Sig Dispense Refill   cholecalciferol (VITAMIN D3) 25 MCG (1000 UNIT) tablet Take 1,000 Units by mouth daily.     ELDERBERRY PO Take by mouth.     hydrochlorothiazide (HYDRODIURIL) 12.5 MG tablet Take 1 tablet by mouth once daily 90 tablet 0   metFORMIN (GLUCOPHAGE) 500 MG tablet Take 1 tablet (500 mg total) by mouth 2 (two) times daily with a meal. 180 tablet 3   Omega-3 Fatty Acids (FISH OIL) 1000 MG CAPS Take by mouth.     Zinc Sulfate (ZINC 15 PO) Take by mouth.     No current facility-administered medications for this visit.    No Known Allergies  Family History  Problem Relation Age of Onset   Diabetes Mother    Hypertension Mother    Depression Mother    Heart attack Mother 80   Diabetes Father    Hypertension Father    Diabetes Maternal Grandmother    Cancer Maternal Grandfather    Diabetes Paternal Grandmother    Cancer Paternal Grandfather     Social History   Socioeconomic History   Marital status: Single    Spouse name: Not on file   Number of children: Not on file   Years of education: Not on file   Highest education level: Not on file  Occupational History   Not on file  Tobacco Use   Smoking status: Never   Smokeless tobacco: Never   Vaping Use   Vaping Use: Never used  Substance and Sexual Activity   Alcohol use: Yes    Alcohol/week: 12.0 standard drinks    Types: 12 Cans of beer per week    Comment: weekly   Drug use: No   Sexual activity: Not Currently    Birth control/protection: None  Other Topics Concern   Not on file  Social History Narrative   Not on file   Social Determinants of Health   Financial Resource Strain: Not on file  Food Insecurity: Not on file  Transportation Needs: Not on file  Physical Activity: Not on file  Stress: Not on file  Social Connections: Not on file  Intimate Partner Violence: Not on file     Constitutional: Denies fever, malaise, fatigue, headache or abrupt weight changes.  HEENT: Denies eye pain, eye redness, ear pain, ringing in the ears, wax buildup, runny nose, nasal congestion, bloody nose, or sore throat. Respiratory: Denies difficulty breathing, shortness of breath, cough or sputum production.   Cardiovascular: Denies chest pain, chest tightness, palpitations or swelling in the hands or feet.  Gastrointestinal: Patient reports intermittent loose stools.  Denies abdominal pain, bloating, constipation, or blood in the stool.  GU: Denies urgency, frequency, pain with urination, burning sensation, blood in urine, odor or  discharge. Musculoskeletal: Patient reports chronic right shoulder pain.  Denies decrease in range of motion, difficulty with gait, muscle pain or joint swelling.  Skin: Denies redness, rashes, lesions or ulcercations.  Neurological: Denies dizziness, difficulty with memory, difficulty with speech or problems with balance and coordination.  Psych: Denies anxiety, depression, SI/HI.  No other specific complaints in a complete review of systems (except as listed in HPI above).  Objective:   Physical Exam  BP 133/67 (BP Location: Right Arm, Patient Position: Sitting, Cuff Size: Large)    Pulse 84    Temp (!) 96.9 F (36.1 C) (Temporal)    Ht 5'  (1.524 m)    Wt 163 lb (73.9 kg)    LMP 04/05/2015 (Approximate)    SpO2 100%    BMI 31.83 kg/m   Wt Readings from Last 3 Encounters:  01/14/21 164 lb (74.4 kg)  08/04/20 161 lb 12.8 oz (73.4 kg)  07/23/20 165 lb 11.2 oz (75.2 kg)    General: Appears her stated age, obese, in NAD. Skin: Warm, dry and intact. No ulcerations noted. HEENT: Head: normal shape and size; Eyes: sclera white and EOMs intact;  Neck:  Neck supple, trachea midline. No masses, lumps or thyromegaly present.  Cardiovascular: Normal rate and rhythm. S1,S2 noted.  No murmur, rubs or gallops noted. No JVD or BLE edema. No carotid bruits noted. Pulmonary/Chest: Normal effort and positive vesicular breath sounds. No respiratory distress. No wheezes, rales or ronchi noted.  Abdomen: Soft and nontender. Normal bowel sounds.  Musculoskeletal: Normal internal and external rotation of the right shoulder.  No bony tenderness noted with the right shoulder.  Positive drop can test on the right.  Strength 5/5 BUE/BLE.  No difficulty with gait.  Neurological: Alert and oriented. Cranial nerves II-XII grossly intact. Coordination normal.  Psychiatric: Mood and affect normal. Behavior is normal. Judgment and thought content normal.    BMET    Component Value Date/Time   NA 139 01/14/2021 0911   NA 140 12/07/2018 1009   K 4.4 01/14/2021 0911   CL 99 01/14/2021 0911   CO2 28 01/14/2021 0911   GLUCOSE 155 (H) 01/14/2021 0911   BUN 16 01/14/2021 0911   BUN 12 12/07/2018 1009   CREATININE 0.64 01/14/2021 0911   CALCIUM 10.3 01/14/2021 0911   GFRNONAA 84 04/22/2020 0832   GFRAA 97 04/22/2020 0832    Lipid Panel     Component Value Date/Time   CHOL 174 01/14/2021 0911   TRIG 249 (H) 01/14/2021 0911   HDL 39 (L) 01/14/2021 0911   CHOLHDL 4.5 01/14/2021 0911   VLDL 58 (H) 11/08/2016 0947   LDLCALC 99 01/14/2021 0911    CBC    Component Value Date/Time   WBC 6.5 01/14/2021 0911   RBC 4.70 01/14/2021 0911   HGB 13.9  01/14/2021 0911   HCT 41.5 01/14/2021 0911   PLT 229 01/14/2021 0911   MCV 88.3 01/14/2021 0911   MCH 29.6 01/14/2021 0911   MCHC 33.5 01/14/2021 0911   RDW 12.6 01/14/2021 0911   LYMPHSABS 1,939 04/22/2020 0832   MONOABS 360 11/08/2016 0947   EOSABS 30 04/22/2020 0832   BASOSABS 22 04/22/2020 0832    Hgb A1C Lab Results  Component Value Date   HGBA1C 7.4 (H) 01/14/2021            Assessment & Plan:   Preventative Health Maintenance:  She declines flu shot Tetanus UTD She declines Pneumovax She declines COVID-vaccine Discussed Shingrix vaccine, she will  check coverage with her insurance company Pap smear due 06/2021, she will schedule with GYN Mammogram and bone density ordered-she will call to schedule Colon screening UTD Encouraged her to consume a balanced diet and exercise regimen Advised her to see an eye doctor and dentist annually We will check CBC, c-Met, lipid, A1c  RTC in 6 months, follow-up chronic conditions Webb Silversmith, NP This visit occurred during the SARS-CoV-2 public health emergency.  Safety protocols were in place, including screening questions prior to the visit, additional usage of staff PPE, and extensive cleaning of exam room while observing appropriate contact time as indicated for disinfecting solutions.

## 2021-05-06 LAB — HEMOGLOBIN A1C
Hgb A1c MFr Bld: 8.1 % of total Hgb — ABNORMAL HIGH (ref <5.7)
Mean Plasma Glucose: 186 mg/dL
eAG (mmol/L): 10.3 mmol/L

## 2021-05-06 LAB — COMPLETE METABOLIC PANEL WITH GFR
AG Ratio: 1.6 (calc) (ref 1.0–2.5)
ALT: 54 U/L — ABNORMAL HIGH (ref 6–29)
AST: 54 U/L — ABNORMAL HIGH (ref 10–35)
Albumin: 4.7 g/dL (ref 3.6–5.1)
Alkaline phosphatase (APISO): 80 U/L (ref 37–153)
BUN: 12 mg/dL (ref 7–25)
CO2: 29 mmol/L (ref 20–32)
Calcium: 10.4 mg/dL (ref 8.6–10.4)
Chloride: 99 mmol/L (ref 98–110)
Creat: 0.67 mg/dL (ref 0.50–1.05)
Globulin: 3 g/dL (calc) (ref 1.9–3.7)
Glucose, Bld: 173 mg/dL — ABNORMAL HIGH (ref 65–99)
Potassium: 4.1 mmol/L (ref 3.5–5.3)
Sodium: 139 mmol/L (ref 135–146)
Total Bilirubin: 0.6 mg/dL (ref 0.2–1.2)
Total Protein: 7.7 g/dL (ref 6.1–8.1)
eGFR: 100 mL/min/{1.73_m2} (ref >=60)

## 2021-05-06 LAB — CBC
HCT: 41.9 % (ref 35.0–45.0)
Hemoglobin: 14.2 g/dL (ref 11.7–15.5)
MCH: 29.3 pg (ref 27.0–33.0)
MCHC: 33.9 g/dL (ref 32.0–36.0)
MCV: 86.4 fL (ref 80.0–100.0)
MPV: 11.2 fL (ref 7.5–12.5)
Platelets: 241 10*3/uL (ref 140–400)
RBC: 4.85 10*6/uL (ref 3.80–5.10)
RDW: 12.5 % (ref 11.0–15.0)
WBC: 6.8 10*3/uL (ref 3.8–10.8)

## 2021-05-06 LAB — LIPID PANEL
Cholesterol: 159 mg/dL (ref <200)
HDL: 41 mg/dL — ABNORMAL LOW (ref >=50)
LDL Cholesterol (Calc): 74 mg/dL (calc)
Non-HDL Cholesterol (Calc): 118 mg/dL (calc) (ref <130)
Total CHOL/HDL Ratio: 3.9 (calc) (ref <5.0)
Triglycerides: 364 mg/dL — ABNORMAL HIGH (ref <150)

## 2021-05-06 MED ORDER — METFORMIN HCL ER (MOD) 1000 MG PO TB24: 1000.0000 mg | ORAL_TABLET | Freq: Two times a day (BID) | ORAL | 0 refills | Status: DC

## 2021-05-06 MED ORDER — LOVASTATIN 10 MG PO TABS: 10.0000 mg | ORAL_TABLET | Freq: Every day | ORAL | 2 refills | Status: DC

## 2021-05-06 NOTE — Addendum Note (Signed)
Addended by: Jearld Fenton on: 05/06/2021 02:38 PM   Modules accepted: Orders

## 2021-05-27 ENCOUNTER — Telehealth: Payer: Self-pay

## 2021-05-27 DIAGNOSIS — E1165 Type 2 diabetes mellitus with hyperglycemia: Secondary | ICD-10-CM

## 2021-05-27 NOTE — Telephone Encounter (Signed)
Referral for ophthalmology changed to [facility]

## 2021-05-27 NOTE — Addendum Note (Signed)
Addended by: Jearld Fenton on: 05/27/2021 11:14 AM   Modules accepted: Orders

## 2021-05-27 NOTE — Telephone Encounter (Signed)
Copied from [place] [id number] [phone]. Topic: General - Other >> May 26, 2021  3:29 PM Valere Dross wrote: Reason for CRM: Pt called in about her eye referral that was sent to [facility] and pt requested if a referral could be sent over to [facility] location due to [place] location being booked up, please advise.

## 2021-06-14 ENCOUNTER — Encounter: Payer: Self-pay | Admitting: Internal Medicine

## 2021-06-14 ENCOUNTER — Other Ambulatory Visit: Payer: Self-pay

## 2021-06-14 ENCOUNTER — Ambulatory Visit (INDEPENDENT_AMBULATORY_CARE_PROVIDER_SITE_OTHER): Payer: BC Managed Care – PPO | Admitting: Internal Medicine

## 2021-06-14 ENCOUNTER — Other Ambulatory Visit (HOSPITAL_COMMUNITY)
Admission: RE | Admit: 2021-06-14 | Discharge: 2021-06-14 | Disposition: A | Discharge status: Home / Self Care | Payer: BC Managed Care – PPO | Source: Ambulatory Visit | Attending: Internal Medicine | Admitting: Internal Medicine

## 2021-06-14 VITALS — BP 125/66 | HR 66 | Temp 96.9°F | Wt 160.0 lb

## 2021-06-14 DIAGNOSIS — R87610 Atypical squamous cells of undetermined significance on cytologic smear of cervix (ASC-US): Secondary | ICD-10-CM

## 2021-06-14 DIAGNOSIS — Z6831 Body mass index (BMI) 31.0-31.9, adult: Secondary | ICD-10-CM

## 2021-06-14 DIAGNOSIS — E6609 Other obesity due to excess calories: Secondary | ICD-10-CM | POA: Diagnosis not present

## 2021-06-14 DIAGNOSIS — R748 Abnormal levels of other serum enzymes: Secondary | ICD-10-CM

## 2021-06-14 NOTE — Assessment & Plan Note (Signed)
Encourage diet and exercise for weight loss 

## 2021-06-14 NOTE — Progress Notes (Signed)
? ?Subjective:  ? ? Patient ID: Courtney Mccarty, female    DOB: [birth year], 61 y.o.   MRN: [id number] ? ?HPI ? ?Patient presents to the clinic today to discuss her elevated liver enzymes.  Her AST/ALT was 54/54.  She reports she does not drink excessively and does not consume that much Tylenol.  She has had elevated liver enzymes in the past.  She has had a negative hep C antibody. ? ?She is also due for her Pap smear.  Her last Pap smear 06/2020 showed ASCUS.  She reports she does not go back to [facility] to have this done.  A pelvic ultrasound was ordered at her last visit but she reports she has not had this scheduled yet. ? ?Review of Systems ? ?   ?Past Medical History:  ?Diagnosis Date  ? Essential hypertension 10/11/2016  ? Hyperlipidemia   ? Type 2 diabetes mellitus ([place])   ? ? ?Current Outpatient Medications  ?Medication Sig Dispense Refill  ? cholecalciferol (VITAMIN D3) 25 MCG (1000 UNIT) tablet Take 1,000 Units by mouth daily.    ? ELDERBERRY PO Take by mouth.    ? hydrochlorothiazide (HYDRODIURIL) 12.5 MG tablet Take 1 tablet by mouth once daily 90 tablet 0  ? lovastatin (MEVACOR) 10 MG tablet Take 1 tablet (10 mg total) by mouth at bedtime. 30 tablet 2  ? metFORMIN (GLUMETZA) 1000 MG (MOD) 24 hr tablet Take 1 tablet (1,000 mg total) by mouth 2 (two) times daily with a meal. 180 tablet 0  ? Multiple Vitamin (MULTIVITAMIN) tablet Take 1 tablet by mouth daily.    ? Omega-3 Fatty Acids (FISH OIL) 1000 MG CAPS Take by mouth.    ? Zinc Sulfate (ZINC 15 PO) Take by mouth.    ? ?No current facility-administered medications for this visit.  ? ? ?No Known Allergies ? ?Family History  ?Problem Relation Age of Onset  ? Diabetes Mother   ? Hypertension Mother   ? Depression Mother   ? Heart attack Mother 17  ? Diabetes Father   ? Hypertension Father   ? Diabetes Maternal Grandmother   ? Cancer Maternal Grandfather   ? Diabetes Paternal Grandmother   ? Cancer Paternal Grandfather   ? ? ?Social History  ? ?Socioeconomic History   ? Marital status: Single  ?  Spouse name: Not on file  ? Number of children: Not on file  ? Years of education: Not on file  ? Highest education level: Not on file  ?Occupational History  ? Not on file  ?Tobacco Use  ? Smoking status: Never  ? Smokeless tobacco: Never  ?Vaping Use  ? Vaping Use: Never used  ?Substance and Sexual Activity  ? Alcohol use: Yes  ?  Alcohol/week: 12.0 standard drinks  ?  Types: 12 Cans of beer per week  ?  Comment: weekly  ? Drug use: No  ? Sexual activity: Not Currently  ?  Birth control/protection: None  ?Other Topics Concern  ? Not on file  ?Social History Narrative  ? Not on file  ? ?Social Determinants of Health  ? ?Financial Resource Strain: Not on file  ?Food Insecurity: Not on file  ?Transportation Needs: Not on file  ?Physical Activity: Not on file  ?Stress: Not on file  ?Social Connections: Not on file  ?Intimate Partner Violence: Not on file  ? ? ? ?Constitutional: Denies fever, malaise, fatigue, headache or abrupt weight changes.  ?Respiratory: Denies difficulty breathing, shortness of breath, cough or sputum production.   ?  Cardiovascular: Denies chest pain, chest tightness, palpitations or swelling in the hands or feet.  ?Gastrointestinal: Patient reports intermittent pelvic pain.  Denies bloating, constipation, diarrhea or blood in the stool.  ?GU: Denies urgency, frequency, pain with urination, burning sensation, blood in urine, odor or discharge. ? ? ?No other specific complaints in a complete review of systems (except as listed in HPI above). ? ?Objective:  ? Physical Exam ? ? ?BP 125/66 (BP Location: Right Arm, Patient Position: Sitting, Cuff Size: Large)   Pulse 66   Temp (!) 96.9 ?F (36.1 ?C) (Temporal)   Wt 160 lb (72.6 kg)   LMP 04/05/2015 (Approximate)   SpO2 98%   BMI 31.25 kg/m?  ?Wt Readings from Last 3 Encounters:  ?06/14/21 160 lb (72.6 kg)  ?05/05/21 163 lb (73.9 kg)  ?01/14/21 164 lb (74.4 kg)  ? ? ?General: Appears her stated age, obese, in  NAD. ?Skin: Warm, dry and intact. No jaundice noted. ?HEENT: Head: normal shape and size; Eyes: sclera white and EOMs intact;  ?Cardiovascular: Normal rate and rhythm. S1,S2 noted.  No murmur, rubs or gallops noted. ?Pulmonary/Chest: Normal effort and positive vesicular breath sounds. No respiratory distress. No wheezes, rales or ronchi noted.  ?Abdomen: Soft and nontender. Normal bowel sounds. No distention or masses noted. Liver, spleen and kidneys non palpable. ?Pelvic: Normal female anatomy.  Cervix without mass or lesion.  No discharge or odor present.  No CMT.  Adnexa nonpalpable. ?Musculoskeletal: No difficulty with gait.  ?Neurological: Alert and oriented.  ? ? ?BMET ?   ?Component Value Date/Time  ? NA 139 05/05/2021 0858  ? NA 140 12/07/2018 1009  ? K 4.1 05/05/2021 0858  ? CL 99 05/05/2021 0858  ? CO2 29 05/05/2021 0858  ? GLUCOSE 173 (H) 05/05/2021 0858  ? BUN 12 05/05/2021 0858  ? BUN 12 12/07/2018 1009  ? CREATININE 0.67 05/05/2021 0858  ? CALCIUM 10.4 05/05/2021 0858  ? GFRNONAA 84 04/22/2020 0832  ? GFRAA 97 04/22/2020 0832  ? ? ?Lipid Panel  ?   ?Component Value Date/Time  ? CHOL 159 05/05/2021 0858  ? TRIG 364 (H) 05/05/2021 0858  ? HDL 41 (L) 05/05/2021 0858  ? CHOLHDL 3.9 05/05/2021 0858  ? VLDL 58 (H) 11/08/2016 0947  ? [place] 74 05/05/2021 0858  ? ? ?CBC ?   ?Component Value Date/Time  ? WBC 6.8 05/05/2021 0858  ? RBC 4.85 05/05/2021 0858  ? HGB 14.2 05/05/2021 0858  ? HCT 41.9 05/05/2021 0858  ? PLT 241 05/05/2021 0858  ? MCV 86.4 05/05/2021 0858  ? MCH 29.3 05/05/2021 0858  ? MCHC 33.9 05/05/2021 0858  ? RDW 12.5 05/05/2021 0858  ? LYMPHSABS 1,939 04/22/2020 0932  ? MONOABS 360 11/08/2016 0947  ? EOSABS 30 04/22/2020 0832  ? BASOSABS 22 04/22/2020 0832  ? ? ?Hgb A1C ?Lab Results  ?Component Value Date  ? HGBA1C 8.1 (H) 05/05/2021  ? ? ? ? ? ? ?   ?Assessment & Plan:  ? ?Abnormal Pap Smear: ? ?Repeat Pap today, she declines STD screening ?We will check on the status of her pelvic  ultrasound ? ?Elevated Liver Enzymes: ? ?RUQ abdominal ultrasound placed ?Encouraged diet and exercise for weight loss ?Avoid excessive EtOH or Tylenol use ? ?RTC in2 months for follow-up of chronic conditions ?Webb Silversmith, NP ?This visit occurred during the SARS-CoV-2 public health emergency.  Safety protocols were in place, including screening questions prior to the visit, additional usage of staff PPE, and extensive cleaning of  exam room while observing appropriate contact time as indicated for disinfecting solutions.  ? ?

## 2021-06-14 NOTE — Patient Instructions (Signed)
Fatty Liver Disease  The liver converts food into energy, removes toxic material from the blood, makes important proteins, and absorbs necessary vitamins from food. Fatty liver disease occurs when too much fat has built up in your liver cells. Fatty liverdisease is also called hepatic steatosis. In many cases, fatty liver disease does not cause symptoms or problems. It is often diagnosed when tests are being done for other reasons. However, over time, fatty liver can cause inflammation that may lead to more serious liver problems, such as scarring of the liver (cirrhosis) and liver failure. Fatty liver is associated with insulin resistance, increased body fat, high blood pressure (hypertension), and high cholesterol. These are features of metabolic syndrome and increaseyour risk for stroke, diabetes, and heart disease. What are the causes? This condition may be caused by components of metabolic syndrome: Obesity. Insulin resistance. High cholesterol. Other causes: Alcohol abuse. Poor nutrition. Cushing syndrome. Pregnancy. Certain drugs. Poisons. Some viral infections. What increases the risk? You are more likely to develop this condition if you: Abuse alcohol. Are overweight. Have diabetes. Have hepatitis. Have a high triglyceride level. Are pregnant. What are the signs or symptoms? Fatty liver disease often does not cause symptoms. If symptoms do develop, they can include: Fatigue and weakness. Weight loss. Confusion. Nausea, vomiting, or abdominal pain. Yellowing of your skin and the white parts of your eyes (jaundice). Itchy skin. How is this diagnosed? This condition may be diagnosed by: A physical exam and your medical history. Blood tests. Imaging tests, such as an ultrasound, CT scan, or MRI. A liver biopsy. A small sample of liver tissue is removed using a needle. The sample is then looked at under a microscope. How is this treated? Fatty liver disease is often  caused by other health conditions. Treatment for fatty liver may involve medicines and lifestyle changes to manage conditions such as: Alcoholism. High cholesterol. Diabetes. Being overweight or obese. Follow these instructions at home:  Do not drink alcohol. If you have trouble quitting, ask your health care provider how to safely quit with the help of medicine or a supervised program. This is important to keep your condition from getting worse. Eat a healthy diet as told by your health care provider. Ask your health care provider about working with a dietitian to develop an eating plan. Exercise regularly. This can help you lose weight and control your cholesterol and diabetes. Talk to your health care provider about an exercise plan and which activities are best for you. Take over-the-counter and prescription medicines only as told by your health care provider. Keep all follow-up visits. This is important. Contact a health care provider if: You have trouble controlling your: Blood sugar. This is especially important if you have diabetes. Cholesterol. Drinking of alcohol. Get help right away if: You have abdominal pain. You have jaundice. You have nausea and are vomiting. You vomit blood or material that looks like coffee grounds. You have stools that are black, tar-like, or bloody. Summary Fatty liver disease develops when too much fat builds up in the cells of your liver. Fatty liver disease often causes no symptoms or problems. However, over time, fatty liver can cause inflammation that may lead to more serious liver problems, such as scarring of the liver (cirrhosis). You are more likely to develop this condition if you abuse alcohol, are pregnant, are overweight, have diabetes, have hepatitis, or have high triglyceride or cholesterol levels. Contact your health care provider if you have trouble controlling your blood sugar, cholesterol,   or drinking of alcohol. This information is  not intended to replace advice given to you by your health care provider. Make sure you discuss any questions you have with your healthcare provider. Document Revised: 01/02/2020 Document Reviewed: 01/02/2020 Elsevier Patient Education  2022 Elsevier Inc.  

## 2021-06-15 LAB — CYTOLOGY - PAP
Adequacy: ABSENT
Diagnosis: NEGATIVE

## 2021-06-22 ENCOUNTER — Ambulatory Visit: Admission: RE | Admit: 2021-06-22 | Payer: BC Managed Care – PPO | Source: Ambulatory Visit

## 2021-06-22 ENCOUNTER — Ambulatory Visit: Payer: BC Managed Care – PPO | Attending: Internal Medicine

## 2021-07-02 ENCOUNTER — Other Ambulatory Visit: Payer: Self-pay | Admitting: Internal Medicine

## 2021-07-05 NOTE — Telephone Encounter (Signed)
Requested medication (s) are due for refill today: pharm requesting change ? ?Requested medication (s) are on the active medication list: yes ? ?Last refill:  05/06/21 #180 with 0 RF ? ?Future visit scheduled: 08/03/21 ? ?Notes to clinic:  Pharm requests as follows: Pharmacy comment: costs too much, need cheaper version. ? ? ?  ? ?Requested Prescriptions  ?Pending Prescriptions Disp Refills  ? metFORMIN (GLUMETZA) 1000 MG (MOD) 24 hr tablet [Pharmacy Med Name: METFORMIN MOD ER 1000MG TAB] 180 tablet 0  ?  Sig: TAKE 1 TABLET BY MOUTH TWICE DAILY WITH MEALS  ?  ? Endocrinology:  Diabetes - Biguanides Failed - 07/02/2021  5:02 PM  ?  ?  Failed - HBA1C is between 0 and 7.9 and within 180 days  ?  Hgb A1c MFr Bld  ?Date Value Ref Range Status  ?05/05/2021 8.1 (H) <5.7 % of total Hgb Final  ?  Comment:  ?  For someone without known diabetes, a hemoglobin A1c ?value of 6.5% or greater indicates that they may have  ?diabetes and this should be confirmed with a follow-up  ?test. ?. ?For someone with known diabetes, a value <7% indicates  ?that their diabetes is well controlled and a value  ?greater than or equal to 7% indicates suboptimal  ?control. A1c targets should be individualized based on  ?duration of diabetes, age, comorbid conditions, and  ?other considerations. ?. ?Currently, no consensus exists regarding use of ?hemoglobin A1c for diagnosis of diabetes for children. ?. ?  ?  ?  ?  ?  Failed - B12 Level in normal range and within 720 days  ?  No results found for: VITAMINB12  ?  ?  ?  Failed - CBC within normal limits and completed in the last 12 months  ?  WBC  ?Date Value Ref Range Status  ?05/05/2021 6.8 3.8 - 10.8 Thousand/uL Final  ? ?RBC  ?Date Value Ref Range Status  ?05/05/2021 4.85 3.80 - 5.10 Million/uL Final  ? ?Hemoglobin  ?Date Value Ref Range Status  ?05/05/2021 14.2 11.7 - 15.5 g/dL Final  ? ?HCT  ?Date Value Ref Range Status  ?05/05/2021 41.9 35.0 - 45.0 % Final  ? ?MCHC  ?Date Value Ref Range Status   ?05/05/2021 33.9 32.0 - 36.0 g/dL Final  ? ?MCH  ?Date Value Ref Range Status  ?05/05/2021 29.3 27.0 - 33.0 pg Final  ? ?MCV  ?Date Value Ref Range Status  ?05/05/2021 86.4 80.0 - 100.0 fL Final  ? ?No results found for: PLTCOUNTKUC, LABPLAT, [place] ?RDW  ?Date Value Ref Range Status  ?05/05/2021 12.5 11.0 - 15.0 % Final  ? ?  ?  ?  Passed - Cr in normal range and within 360 days  ?  Creat  ?Date Value Ref Range Status  ?05/05/2021 0.67 0.50 - 1.05 mg/dL Final  ? ?Creatinine, Urine  ?Date Value Ref Range Status  ?01/14/2021 105 20 - 275 mg/dL Final  ?  ?  ?  ?  Passed - eGFR in normal range and within 360 days  ?  GFR, Est African American  ?Date Value Ref Range Status  ?04/22/2020 97 > OR = 60 mL/min/1.53m Final  ? ?GFR, Est Non African American  ?Date Value Ref Range Status  ?04/22/2020 84 > OR = 60 mL/min/1.746mFinal  ? ?eGFR  ?Date Value Ref Range Status  ?05/05/2021 100 > OR = 60 mL/min/1.735minal  ?  Comment:  ?  The eGFR is based on the CKD-EPI  2021 equation. To calculate  ?the new eGFR from a previous Creatinine or Cystatin C ?result, go to https://www.kidney.org/professionals/ ?kdoqi/gfr%5Fcalculator ?  ?  ?  ?  ?  Passed - Valid encounter within last 6 months  ?  Recent Outpatient Visits   ? ?      ? 3 weeks ago Elevated liver enzymes  ? [facility] [place], Coralie Keens, NP  ? 2 months ago Encounter for screening mammogram for malignant neoplasm of breast  ? [facility] [place], Coralie Keens, NP  ? 5 months ago Essential hypertension  ? [facility] [place], [place] W, NP  ? 11 months ago Type 2 diabetes mellitus with hyperglycemia, without long-term current use of insulin ([place])  ? [place], DO  ? 1 year ago Palpitations  ? [facility] Kathrine Haddock, NP  ? ?  ?  ?Future Appointments   ? ?        ? In 4 weeks Baity, Coralie Keens, NP [facility], [place]  ? In 1 month Baity, Coralie Keens, NP [facility], [place]  ? ?  ? ?  ?  ?  ? ? ?

## 2021-07-06 ENCOUNTER — Ambulatory Visit
Admission: RE | Admit: 2021-07-06 | Discharge: 2021-07-06 | Disposition: A | Discharge status: Home / Self Care | Payer: BC Managed Care – PPO | Source: Ambulatory Visit | Attending: Internal Medicine | Admitting: Internal Medicine

## 2021-07-06 DIAGNOSIS — Z780 Asymptomatic menopausal state: Secondary | ICD-10-CM

## 2021-07-06 DIAGNOSIS — Z1231 Encounter for screening mammogram for malignant neoplasm of breast: Secondary | ICD-10-CM | POA: Diagnosis present

## 2021-07-13 ENCOUNTER — Inpatient Hospital Stay
Admission: RE | Admit: 2021-07-13 | Discharge: 2021-07-13 | Disposition: A | Discharge status: Home / Self Care | Payer: Self-pay | Source: Ambulatory Visit | Attending: *Deleted | Admitting: *Deleted

## 2021-07-13 ENCOUNTER — Other Ambulatory Visit: Payer: Self-pay | Admitting: Internal Medicine

## 2021-07-13 ENCOUNTER — Other Ambulatory Visit: Payer: Self-pay | Admitting: *Deleted

## 2021-07-13 ENCOUNTER — Encounter: Payer: Self-pay | Admitting: *Deleted

## 2021-07-13 DIAGNOSIS — Z1231 Encounter for screening mammogram for malignant neoplasm of breast: Secondary | ICD-10-CM

## 2021-07-13 DIAGNOSIS — R921 Mammographic calcification found on diagnostic imaging of breast: Secondary | ICD-10-CM

## 2021-07-13 DIAGNOSIS — R928 Other abnormal and inconclusive findings on diagnostic imaging of breast: Secondary | ICD-10-CM

## 2021-07-13 DIAGNOSIS — Z006 Encounter for examination for normal comparison and control in clinical research program: Secondary | ICD-10-CM

## 2021-07-13 NOTE — Research (Signed)
Voicemail left to inform her of essence research study. Encouraged her to call me back. ?

## 2021-07-14 ENCOUNTER — Other Ambulatory Visit: Payer: Self-pay | Admitting: Internal Medicine

## 2021-07-14 DIAGNOSIS — I10 Essential (primary) hypertension: Secondary | ICD-10-CM

## 2021-07-15 ENCOUNTER — Ambulatory Visit
Admission: RE | Admit: 2021-07-15 | Discharge: 2021-07-15 | Disposition: A | Discharge status: Home / Self Care | Payer: BC Managed Care – PPO | Source: Ambulatory Visit | Attending: Internal Medicine | Admitting: Internal Medicine

## 2021-07-15 ENCOUNTER — Other Ambulatory Visit: Payer: Self-pay | Admitting: Internal Medicine

## 2021-07-15 DIAGNOSIS — R102 Pelvic and perineal pain: Secondary | ICD-10-CM | POA: Insufficient documentation

## 2021-07-15 DIAGNOSIS — R748 Abnormal levels of other serum enzymes: Secondary | ICD-10-CM | POA: Diagnosis present

## 2021-07-15 NOTE — Telephone Encounter (Signed)
Requested Prescriptions  ?Pending Prescriptions Disp Refills  ?? hydrochlorothiazide (HYDRODIURIL) 12.5 MG tablet [Pharmacy Med Name: hydroCHLOROthiazide 12.5 MG Oral Tablet] 90 tablet 0  ?  Sig: Take 1 tablet by mouth once daily  ?  ? Cardiovascular: Diuretics - Thiazide Passed - 07/14/2021  1:54 PM  ?  ?  Passed - Cr in normal range and within 180 days  ?  Creat  ?Date Value Ref Range Status  ?05/05/2021 0.67 0.50 - 1.05 mg/dL Final  ? ?Creatinine, Urine  ?Date Value Ref Range Status  ?01/14/2021 105 20 - 275 mg/dL Final  ?   ?  ?  Passed - K in normal range and within 180 days  ?  Potassium  ?Date Value Ref Range Status  ?05/05/2021 4.1 3.5 - 5.3 mmol/L Final  ?   ?  ?  Passed - Na in normal range and within 180 days  ?  Sodium  ?Date Value Ref Range Status  ?05/05/2021 139 135 - 146 mmol/L Final  ?12/07/2018 140 134 - 144 mmol/L Final  ?   ?  ?  Passed - Last BP in normal range  ?  BP Readings from Last 1 Encounters:  ?06/14/21 125/66  ?   ?  ?  Passed - Valid encounter within last 6 months  ?  Recent Outpatient Visits   ?      ? 1 month ago Elevated liver enzymes  ? [facility] [place], Coralie Keens, NP  ? 2 months ago Encounter for screening mammogram for malignant neoplasm of breast  ? [facility] [place], Coralie Keens, NP  ? 6 months ago Essential hypertension  ? [facility] [place], [place] W, NP  ? 11 months ago Type 2 diabetes mellitus with hyperglycemia, without long-term current use of insulin ([place])  ? [place], DO  ? 1 year ago Palpitations  ? [facility] Kathrine Haddock, NP  ?  ?  ?Future Appointments   ?        ? In 2 weeks Baity, Coralie Keens, NP [facility], [place]  ? In 3 weeks Baity, Coralie Keens, NP [facility], [place]  ?  ? ?  ?  ?  ? ?

## 2021-07-29 ENCOUNTER — Encounter: Payer: Self-pay | Admitting: *Deleted

## 2021-07-29 DIAGNOSIS — Z006 Encounter for examination for normal comparison and control in clinical research program: Secondary | ICD-10-CM

## 2021-07-29 NOTE — Research (Signed)
I called patient for Essence Study which is looking at medication to lower triglycerides. Patient stated not interested because does not want to take any more medications. I thanked patient for her time. ?

## 2021-08-03 ENCOUNTER — Ambulatory Visit: Payer: BC Managed Care – PPO | Admitting: Internal Medicine

## 2021-08-03 ENCOUNTER — Encounter: Payer: Self-pay | Admitting: Internal Medicine

## 2021-08-03 VITALS — BP 108/76 | HR 70 | Temp 96.6°F | Ht 60.5 in | Wt 155.0 lb

## 2021-08-03 DIAGNOSIS — I10 Essential (primary) hypertension: Secondary | ICD-10-CM

## 2021-08-03 DIAGNOSIS — E663 Overweight: Secondary | ICD-10-CM | POA: Diagnosis not present

## 2021-08-03 DIAGNOSIS — M7989 Other specified soft tissue disorders: Secondary | ICD-10-CM

## 2021-08-03 DIAGNOSIS — Z6829 Body mass index (BMI) 29.0-29.9, adult: Secondary | ICD-10-CM

## 2021-08-03 DIAGNOSIS — Z0001 Encounter for general adult medical examination with abnormal findings: Secondary | ICD-10-CM

## 2021-08-03 MED ORDER — HYDROCHLOROTHIAZIDE 12.5 MG PO TABS: 12.5000 mg | ORAL_TABLET | Freq: Every day | ORAL | 0 refills | Status: DC

## 2021-08-03 MED ORDER — LOVASTATIN 10 MG PO TABS: 10.0000 mg | ORAL_TABLET | Freq: Every day | ORAL | 0 refills | Status: DC

## 2021-08-03 MED ORDER — METFORMIN HCL ER 750 MG PO TB24: 750.0000 mg | ORAL_TABLET | Freq: Two times a day (BID) | ORAL | 0 refills | Status: DC

## 2021-08-03 NOTE — Patient Instructions (Signed)

## 2021-08-03 NOTE — Progress Notes (Signed)
? ?Subjective:  ? ? Patient ID: Courtney Mccarty, female    DOB: [birth year], 61 y.o.   MRN: [id number] ? ?HPI ? ?Patient presents to clinic today for her annual exam. ? ?Flu: never ?Tetanus: 11/2016 ?COVID: never ?Pneumovax: never ?Shingrix: never ?Pap smear: 06/2021 ?Mammogram: 07/2021 ?Bone density: 07/2021 ?Colon screening: 11/2016 ?Vision screening: as needed ?Dentist: as needed ? ?Diet: She does eat meat. She consumes fruits and veggies. She does eat some fried foods. She drinks mostly water. ?Exercise: None ? ?Review of Systems ? ?   ?Past Medical History:  ?Diagnosis Date  ? Essential hypertension 10/11/2016  ? Hyperlipidemia   ? Type 2 diabetes mellitus ([place])   ? ? ?Current Outpatient Medications  ?Medication Sig Dispense Refill  ? cholecalciferol (VITAMIN D3) 25 MCG (1000 UNIT) tablet Take 1,000 Units by mouth daily.    ? hydrochlorothiazide (HYDRODIURIL) 12.5 MG tablet Take 1 tablet by mouth once daily 90 tablet 0  ? lovastatin (MEVACOR) 10 MG tablet Take 1 tablet (10 mg total) by mouth at bedtime. 30 tablet 2  ? metFORMIN (GLUCOPHAGE-XR) 750 MG 24 hr tablet Take 1 tablet (750 mg total) by mouth 2 (two) times daily. 180 tablet 0  ? Multiple Vitamin (MULTIVITAMIN) tablet Take 1 tablet by mouth daily.    ? ?No current facility-administered medications for this visit.  ? ? ?No Known Allergies ? ?Family History  ?Problem Relation Age of Onset  ? Diabetes Mother   ? Hypertension Mother   ? Depression Mother   ? Heart attack Mother 55  ? Diabetes Father   ? Hypertension Father   ? Diabetes Maternal Grandmother   ? Cancer Maternal Grandfather   ? Diabetes Paternal Grandmother   ? Cancer Paternal Grandfather   ? ? ?Social History  ? ?Socioeconomic History  ? Marital status: Single  ?  Spouse name: Not on file  ? Number of children: Not on file  ? Years of education: Not on file  ? Highest education level: Not on file  ?Occupational History  ? Not on file  ?Tobacco Use  ? Smoking status: Never  ? Smokeless tobacco: Never   ?Vaping Use  ? Vaping Use: Never used  ?Substance and Sexual Activity  ? Alcohol use: Yes  ?  Alcohol/week: 12.0 standard drinks  ?  Types: 12 Cans of beer per week  ?  Comment: weekly  ? Drug use: No  ? Sexual activity: Not Currently  ?  Birth control/protection: None  ?Other Topics Concern  ? Not on file  ?Social History Narrative  ? Not on file  ? ?Social Determinants of Health  ? ?Financial Resource Strain: Not on file  ?Food Insecurity: Not on file  ?Transportation Needs: Not on file  ?Physical Activity: Not on file  ?Stress: Not on file  ?Social Connections: Not on file  ?Intimate Partner Violence: Not on file  ? ? ? ?Constitutional: Denies fever, malaise, fatigue, headache or abrupt weight changes.  ?HEENT: Denies eye pain, eye redness, ear pain, ringing in the ears, wax buildup, runny nose, nasal congestion, bloody nose, or sore throat. ?Respiratory: Denies difficulty breathing, shortness of breath, cough or sputum production.   ?Cardiovascular: Denies chest pain, chest tightness, palpitations or swelling in the hands or feet.  ?Gastrointestinal: Denies abdominal pain, bloating, constipation, diarrhea or blood in the stool.  ?GU: Denies urgency, frequency, pain with urination, burning sensation, blood in urine, odor or discharge. ?Musculoskeletal: Patient reports swelling of her right foot.  Denies decrease in range  of motion, difficulty with gait, muscle pain or joint pain.  ?Skin: Denies redness, rashes, lesions or ulcercations.  ?Neurological: Denies dizziness, difficulty with memory, difficulty with speech or problems with balance and coordination.  ?Psych: Denies anxiety, depression, SI/HI. ? ?No other specific complaints in a complete review of systems (except as listed in HPI above). ? ?Objective:  ? Physical Exam ? ?BP 108/76 (BP Location: Left Arm, Patient Position: Sitting, Cuff Size: Large)   Pulse 70   Temp (!) 96.6 ?F (35.9 ?C) (Temporal)   Ht 5' 0.5" (1.537 m)   Wt 155 lb (70.3 kg)   LMP  04/05/2015 (Approximate)   SpO2 98%   BMI 29.77 kg/m?  ? ?Wt Readings from Last 3 Encounters:  ?06/14/21 160 lb (72.6 kg)  ?05/05/21 163 lb (73.9 kg)  ?01/14/21 164 lb (74.4 kg)  ? ? ?General: Appears her stated age, overweight, in NAD. ?Skin: Warm, dry and intact. No ulcerations noted.  Redness noted under the distal plantar aspect of the second metatarsal. ?HEENT: Head: normal shape and size; Eyes: sclera white, no icterus, conjunctiva pink, PERRLA and EOMs intact;  ?Neck:  Neck supple, trachea midline. No masses, lumps or thyromegaly present.  ?Cardiovascular: Normal rate and rhythm. S1,S2 noted.  No murmur, rubs or gallops noted. No JVD or BLE edema. No carotid bruits noted. ?Pulmonary/Chest: Normal effort and positive vesicular breath sounds. No respiratory distress. No wheezes, rales or ronchi noted.  ?Abdomen: Soft and nontender. Normal bowel sounds.  ?Musculoskeletal: Strength 5/5 BUE/BLE.  No difficulty with gait.  ?Neurological: Alert and oriented. Cranial nerves II-XII grossly intact. Coordination normal.  ?Psychiatric: Mood and affect normal. Behavior is normal. Judgment and thought content normal.  ? ? ? ?BMET ?   ?Component Value Date/Time  ? NA 139 05/05/2021 0858  ? NA 140 12/07/2018 1009  ? K 4.1 05/05/2021 0858  ? CL 99 05/05/2021 0858  ? CO2 29 05/05/2021 0858  ? GLUCOSE 173 (H) 05/05/2021 0858  ? BUN 12 05/05/2021 0858  ? BUN 12 12/07/2018 1009  ? CREATININE 0.67 05/05/2021 0858  ? CALCIUM 10.4 05/05/2021 0858  ? GFRNONAA 84 04/22/2020 0832  ? GFRAA 97 04/22/2020 0832  ? ? ?Lipid Panel  ?   ?Component Value Date/Time  ? CHOL 159 05/05/2021 0858  ? TRIG 364 (H) 05/05/2021 0858  ? HDL 41 (L) 05/05/2021 0858  ? CHOLHDL 3.9 05/05/2021 0858  ? VLDL 58 (H) 11/08/2016 0947  ? [place] 74 05/05/2021 0858  ? ? ?CBC ?   ?Component Value Date/Time  ? WBC 6.8 05/05/2021 0858  ? RBC 4.85 05/05/2021 0858  ? HGB 14.2 05/05/2021 0858  ? HCT 41.9 05/05/2021 0858  ? PLT 241 05/05/2021 0858  ? MCV 86.4 05/05/2021  0858  ? MCH 29.3 05/05/2021 0858  ? MCHC 33.9 05/05/2021 0858  ? RDW 12.5 05/05/2021 0858  ? LYMPHSABS 1,939 04/22/2020 9326  ? MONOABS 360 11/08/2016 0947  ? EOSABS 30 04/22/2020 0832  ? BASOSABS 22 04/22/2020 0832  ? ? ?Hgb A1C ?Lab Results  ?Component Value Date  ? HGBA1C 8.1 (H) 05/05/2021  ? ? ? ? ? ? ? ?   ?Assessment & Plan:  ? ? ?Preventative Health Maintenance: ? ?Encouraged her to get a flu shot in the fall ?Tetanus UTD ?Encouraged her to get her COVID-vaccine ?She declines Pneumovax ?Discussed Shingrix vaccine, she will check coverage with her insurance company and schedule a nurse visit if she would like to have this done ?Pap smear UTD ?  Mammogram UTD ?Bone density UTD ?Colon screening UTD encouraged her to consume a balanced diet and exercise regimen ?Advised her to see an eye doctor and dentist annually ?We will check lipid, A1c today ? ?Localized Swelling of Right Foot: ? ?Concerning for gout ?Could be possibly related to HCTZ ?Uric acid level today ? ? ?RTC in 3 months, follow up chronic conditions ?Webb Silversmith, NP ? ? ?

## 2021-08-03 NOTE — Assessment & Plan Note (Signed)
Encourage diet and exercise for weight loss 

## 2021-08-04 ENCOUNTER — Other Ambulatory Visit: Payer: Self-pay

## 2021-08-04 LAB — LIPID PANEL
Cholesterol: 140 mg/dL (ref <200)
HDL: 39 mg/dL — ABNORMAL LOW (ref >=50)
LDL Cholesterol (Calc): 75 mg/dL (calc)
Non-HDL Cholesterol (Calc): 101 mg/dL (calc) (ref <130)
Total CHOL/HDL Ratio: 3.6 (calc) (ref <5.0)
Triglycerides: 159 mg/dL — ABNORMAL HIGH (ref <150)

## 2021-08-04 LAB — URIC ACID: Uric Acid, Serum: 7 mg/dL (ref 2.5–7.0)

## 2021-08-04 LAB — HEMOGLOBIN A1C
Hgb A1c MFr Bld: 8 % of total Hgb — ABNORMAL HIGH (ref <5.7)
Mean Plasma Glucose: 183 mg/dL
eAG (mmol/L): 10.1 mmol/L

## 2021-08-04 NOTE — Telephone Encounter (Signed)
Pt advised.  She agreed to start glipizide '5mg'$ .  Please sent to [place].  Pt is scheduled for 11/08/2021.  ? ?Thanks,  ? ?-Mickel Baas  ?

## 2021-08-04 NOTE — Telephone Encounter (Signed)
-----   Message from Jearld Fenton, NP sent at 08/04/2021  9:38 AM EDT ----- ?Cholesterol looks good but triglycerides are still slightly elevated.  A1c is 8%, not great.  We are going to have to start her on another diabetic medication.  I would like to put her on glipizide 5 mg 2 times daily.  Please let me know if she is agreeable and I will send this in.  Her uric acid level is normal.  She should consume a low carb, low saturated fat diet and increase aerobic exercise.  I want her to follow-up with me in 3 months. ?

## 2021-08-05 MED ORDER — GLIPIZIDE 5 MG PO TABS: 5.0000 mg | ORAL_TABLET | Freq: Two times a day (BID) | ORAL | 1 refills | Status: DC

## 2021-08-10 ENCOUNTER — Ambulatory Visit: Payer: BC Managed Care – PPO | Admitting: Internal Medicine

## 2021-08-13 ENCOUNTER — Ambulatory Visit
Admission: RE | Admit: 2021-08-13 | Discharge: 2021-08-13 | Disposition: A | Discharge status: Home / Self Care | Payer: BC Managed Care – PPO | Source: Ambulatory Visit | Attending: Internal Medicine | Admitting: Internal Medicine

## 2021-08-13 DIAGNOSIS — R928 Other abnormal and inconclusive findings on diagnostic imaging of breast: Secondary | ICD-10-CM | POA: Diagnosis present

## 2021-08-13 DIAGNOSIS — R921 Mammographic calcification found on diagnostic imaging of breast: Secondary | ICD-10-CM | POA: Diagnosis present

## 2021-08-16 ENCOUNTER — Other Ambulatory Visit: Payer: Self-pay | Admitting: Internal Medicine

## 2021-08-16 DIAGNOSIS — R928 Other abnormal and inconclusive findings on diagnostic imaging of breast: Secondary | ICD-10-CM

## 2021-08-16 DIAGNOSIS — R921 Mammographic calcification found on diagnostic imaging of breast: Secondary | ICD-10-CM

## 2021-09-02 ENCOUNTER — Ambulatory Visit
Admission: RE | Admit: 2021-09-02 | Discharge: 2021-09-02 | Disposition: A | Discharge status: Home / Self Care | Payer: BC Managed Care – PPO | Source: Ambulatory Visit | Attending: Internal Medicine | Admitting: Internal Medicine

## 2021-09-02 DIAGNOSIS — R921 Mammographic calcification found on diagnostic imaging of breast: Secondary | ICD-10-CM | POA: Insufficient documentation

## 2021-09-02 DIAGNOSIS — R928 Other abnormal and inconclusive findings on diagnostic imaging of breast: Secondary | ICD-10-CM | POA: Insufficient documentation

## 2021-09-02 HISTORY — PX: BREAST BIOPSY: SHX20

## 2021-09-03 LAB — SURGICAL PATHOLOGY

## 2021-10-19 DIAGNOSIS — R202 Paresthesia of skin: Secondary | ICD-10-CM | POA: Insufficient documentation

## 2021-10-20 ENCOUNTER — Telehealth: Payer: Self-pay

## 2021-10-20 ENCOUNTER — Ambulatory Visit: Payer: Self-pay | Admitting: *Deleted

## 2021-10-20 ENCOUNTER — Ambulatory Visit (INDEPENDENT_AMBULATORY_CARE_PROVIDER_SITE_OTHER): Payer: BC Managed Care – PPO | Admitting: Internal Medicine

## 2021-10-20 ENCOUNTER — Encounter: Payer: Self-pay | Admitting: Internal Medicine

## 2021-10-20 VITALS — BP 136/78 | HR 73 | Temp 96.9°F | Wt 163.0 lb

## 2021-10-20 DIAGNOSIS — R079 Chest pain, unspecified: Secondary | ICD-10-CM

## 2021-10-20 DIAGNOSIS — R778 Other specified abnormalities of plasma proteins: Secondary | ICD-10-CM | POA: Diagnosis not present

## 2021-10-20 NOTE — Patient Outreach (Signed)
  Care Coordination [facility] Note Transition Care Management Unsuccessful Follow-up Telephone Call  Date of discharge and from where:  10/20/21 FROM [facility] Attempts:  1st Attempt  Reason for unsuccessful TCM follow-up call:  Left voice message  Hubert Azure RN, MSN [place] [phone] [email or web address]

## 2021-10-20 NOTE — Progress Notes (Signed)
Subjective:    Patient ID: Courtney Mccarty, female    DOB: 02/22/1961, 61 y.o.   MRN: [id number]  HPI  Patient presents to clinic today for hospital follow-up.  She presented to the ER 7/17 with complaint of chest heaviness, dyspnea on exertion and tingling in her left arm.  ECG showed normal sinus rhythm.  Labs revealed elevated triglycerides, elevated liver enzymes and elevated troponin which peaked at 51.  She was admitted for observation and discharged 7/18 with advice to follow-up with [facility] cardiology for an outpatient stress test.  Since discharge, she denies chest pain, shortness of breath or tingling in her left arm. She has been under a lot of stress lately and thinks this is contributing to her chest pain.  Review of Systems   Past Medical History:  Diagnosis Date   Essential hypertension 10/11/2016   Hyperlipidemia    Type 2 diabetes mellitus ([facility])     Current Outpatient Medications  Medication Sig Dispense Refill   cholecalciferol (VITAMIN D3) 25 MCG (1000 UNIT) tablet Take 1,000 Units by mouth daily.     glipiZIDE (GLUCOTROL) 5 MG tablet Take 1 tablet (5 mg total) by mouth 2 (two) times daily before a meal. 180 tablet 1   hydrochlorothiazide (HYDRODIURIL) 12.5 MG tablet Take 1 tablet (12.5 mg total) by mouth daily. 90 tablet 0   lovastatin (MEVACOR) 10 MG tablet Take 1 tablet (10 mg total) by mouth at bedtime. 90 tablet 0   metFORMIN (GLUCOPHAGE-XR) 750 MG 24 hr tablet Take 1 tablet (750 mg total) by mouth 2 (two) times daily. 180 tablet 0   Multiple Vitamin (MULTIVITAMIN) tablet Take 1 tablet by mouth daily.     No current facility-administered medications for this visit.    No Known Allergies  Family History  Problem Relation Age of Onset   Diabetes Mother    Hypertension Mother    Depression Mother    Heart attack Mother 30   Diabetes Father    Hypertension Father    Diabetes Maternal Grandmother    Cancer Maternal Grandfather    Diabetes Paternal Grandmother     Cancer Paternal Grandfather     Social History   Socioeconomic History   Marital status: Single    Spouse name: Not on file   Number of children: Not on file   Years of education: Not on file   Highest education level: Not on file  Occupational History   Not on file  Tobacco Use   Smoking status: Never   Smokeless tobacco: Never  Vaping Use   Vaping Use: Never used  Substance and Sexual Activity   Alcohol use: Yes    Alcohol/week: 12.0 standard drinks of alcohol    Types: 12 Cans of beer per week    Comment: weekly   Drug use: No   Sexual activity: Not Currently    Birth control/protection: None  Other Topics Concern   Not on file  Social History Narrative   Not on file   Social Determinants of Health   Financial Resource Strain: Not on file  Food Insecurity: Not on file  Transportation Needs: Not on file  Physical Activity: Not on file  Stress: Not on file  Social Connections: Not on file  Intimate Partner Violence: Not on file     Constitutional: Denies fever, malaise, fatigue, headache or abrupt weight changes.  Respiratory: Denies difficulty breathing, shortness of breath, cough or sputum production.   Cardiovascular: Denies chest pain, chest tightness, palpitations or  swelling in the hands or feet.  Gastrointestinal: Denies abdominal pain, bloating, constipation, diarrhea or blood in the stool.  GU: Denies urgency, frequency, pain with urination, burning sensation, blood in urine, odor or discharge. Musculoskeletal: Denies decrease in range of motion, difficulty with gait, muscle pain or joint pain and swelling.  Skin: Denies redness, rashes, lesions or ulcercations.  Neurological: Denies dizziness, difficulty with memory, difficulty with speech or problems with balance and coordination.  Psych: Pt reports stress. Denies anxiety, depression, SI/HI.  No other specific complaints in a complete review of systems (except as listed in HPI above).  Objective:    Physical Exam BP 136/78 (BP Location: Right Arm, Patient Position: Sitting, Cuff Size: Normal)   Pulse 73   Temp (!) 96.9 F (36.1 C) (Temporal)   Wt 163 lb (73.9 kg)   LMP 04/05/2015 (Approximate)   SpO2 98%   BMI 31.31 kg/m   Wt Readings from Last 3 Encounters:  08/03/21 155 lb (70.3 kg)  06/14/21 160 lb (72.6 kg)  05/05/21 163 lb (73.9 kg)    General: Appears her stated age, obese, in NAD. Cardiovascular: Normal rate and rhythm. S1,S2 noted.  No murmur, rubs or gallops noted. No JVD or BLE edema. No carotid bruits noted. Pulmonary/Chest: Normal effort and positive vesicular breath sounds. No respiratory distress. No wheezes, rales or ronchi noted.  Musculoskeletal:  No difficulty with gait.  Neurological: Alert and oriented. Cranial nerves II-XII grossly intact. Coordination normal.    BMET    Component Value Date/Time   NA 139 05/05/2021 0858   NA 140 12/07/2018 1009   K 4.1 05/05/2021 0858   CL 99 05/05/2021 0858   CO2 29 05/05/2021 0858   GLUCOSE 173 (H) 05/05/2021 0858   BUN 12 05/05/2021 0858   BUN 12 12/07/2018 1009   CREATININE 0.67 05/05/2021 0858   CALCIUM 10.4 05/05/2021 0858   GFRNONAA 84 04/22/2020 0832   GFRAA 97 04/22/2020 0832    Lipid Panel     Component Value Date/Time   CHOL 140 08/03/2021 0842   TRIG 159 (H) 08/03/2021 0842   HDL 39 (L) 08/03/2021 0842   CHOLHDL 3.6 08/03/2021 0842   VLDL 58 (H) 11/08/2016 0947   LDLCALC 75 08/03/2021 0842    CBC    Component Value Date/Time   WBC 6.8 05/05/2021 0858   RBC 4.85 05/05/2021 0858   HGB 14.2 05/05/2021 0858   HCT 41.9 05/05/2021 0858   PLT 241 05/05/2021 0858   MCV 86.4 05/05/2021 0858   MCH 29.3 05/05/2021 0858   MCHC 33.9 05/05/2021 0858   RDW 12.5 05/05/2021 0858   LYMPHSABS 1,939 04/22/2020 0832   MONOABS 360 11/08/2016 0947   EOSABS 30 04/22/2020 0832   BASOSABS 22 04/22/2020 0832    Hgb A1C Lab Results  Component Value Date   HGBA1C 8.0 (H) 08/03/2021             Assessment & Plan:   Hospital Follow Up for Chest Pain, Elevated Troponin:  Hospital notes, labs and imaging reviewed Will repeat CMET and Troponin Referral to cardiology placed Continue current meds  RTC in 3 weeks for follow-up chronic conditions Webb Silversmith, NP

## 2021-10-20 NOTE — Telephone Encounter (Signed)
Copied from [place] [id number]. Topic: General - Other >> Oct 19, 2021  3:58 PM Ludger Nutting wrote: Dr. Tobias Alexander from [facility] called and wanted to give an update about pts visit to hospital. He said the discharge papers could be seen in [place]. Please have Webb Silversmith, NP follow up with Dr. Fabio Neighbors.

## 2021-10-20 NOTE — Patient Instructions (Signed)
Nonspecific Chest Pain Chest pain can be caused by many different conditions. Some causes of chest pain can be life-threatening. These will require treatment right away. Serious causes of chest pain include: Heart attack. A tear in the body's main blood vessel. Redness and swelling (inflammation) around your heart. Blood clot in your lungs. Other causes of chest pain may not be so serious. These include: Heartburn. Anxiety or stress. Damage to bones or muscles in your chest. Lung infections. Chest pain can feel like: Pain or discomfort in your chest. Crushing, pressure, aching, or squeezing pain. Burning or tingling. Dull or sharp pain that is worse when you move, cough, or take a deep breath. Pain or discomfort that is also felt in your back, neck, jaw, shoulder, or arm, or pain that spreads to any of these areas. It is hard to know whether your pain is caused by something that is serious or something that is not so serious. So it is important to see your doctor right away if you have chest pain. Follow these instructions at home: Medicines Take over-the-counter and prescription medicines only as told by your doctor. If you were prescribed an antibiotic medicine, take it as told by your doctor. Do not stop taking the antibiotic even if you start to feel better. Lifestyle  Rest as told by your doctor. Do not use any products that contain nicotine or tobacco, such as cigarettes, e-cigarettes, and chewing tobacco. If you need help quitting, ask your doctor. Do not drink alcohol. Make lifestyle changes as told by your doctor. These may include: Getting regular exercise. Ask your doctor what activities are safe for you. Eating a heart-healthy diet. A diet and nutrition specialist (dietitian) can help you to learn healthy eating options. Staying at a healthy weight. Treating diabetes or high blood pressure, if needed. Lowering your stress. Activities such as yoga and relaxation techniques  can help. General instructions Pay attention to any changes in your symptoms. Tell your doctor about them or any new symptoms. Avoid any activities that cause chest pain. Keep all follow-up visits as told by your doctor. This is important. You may need more testing if your chest pain does not go away. Contact a doctor if: Your chest pain does not go away. You feel depressed. You have a fever. Get help right away if: Your chest pain is worse. You have a cough that gets worse, or you cough up blood. You have very bad (severe) pain in your belly (abdomen). You pass out (faint). You have either of these for no clear reason: Sudden chest discomfort. Sudden discomfort in your arms, back, neck, or jaw. You have shortness of breath at any time. You suddenly start to sweat, or your skin gets clammy. You feel sick to your stomach (nauseous). You throw up (vomit). You suddenly feel lightheaded or dizzy. You feel very weak or tired. Your heart starts to beat fast, or it feels like it is skipping beats. These symptoms may be an emergency. Do not wait to see if the symptoms will go away. Get medical help right away. Call your local emergency services (911 in the [place] Do not drive yourself to the hospital. Summary Chest pain can be caused by many different conditions. The cause may be serious and need treatment right away. If you have chest pain, see your doctor right away. Follow your doctor's instructions for taking medicines and making lifestyle changes. Keep all follow-up visits as told by your doctor. This includes visits for any further   testing if your chest pain does not go away. Be sure to know the signs that show that your condition has become worse. Get help right away if you have these symptoms. This information is not intended to replace advice given to you by your health care provider. Make sure you discuss any questions you have with your health care provider. Document Revised:  06/04/2020 Document Reviewed: 06/04/2020 Elsevier Patient Education  2023 [employer] Inc.  

## 2021-10-20 NOTE — Telephone Encounter (Signed)
ED note reviewed.  Have patient schedule hospital follow-up with me.

## 2021-10-21 ENCOUNTER — Other Ambulatory Visit: Payer: Self-pay | Admitting: *Deleted

## 2021-10-21 LAB — TROPONIN I: Troponin I: 36 ng/L (ref <=47)

## 2021-10-21 LAB — COMPLETE METABOLIC PANEL WITH GFR
AG Ratio: 1.5 (calc) (ref 1.0–2.5)
ALT: 45 U/L — ABNORMAL HIGH (ref 6–29)
AST: 33 U/L (ref 10–35)
Albumin: 4.5 g/dL (ref 3.6–5.1)
Alkaline phosphatase (APISO): 70 U/L (ref 37–153)
BUN: 14 mg/dL (ref 7–25)
CO2: 24 mmol/L (ref 20–32)
Calcium: 10 mg/dL (ref 8.6–10.4)
Chloride: 104 mmol/L (ref 98–110)
Creat: 0.8 mg/dL (ref 0.50–1.05)
Globulin: 3.1 g/dL (calc) (ref 1.9–3.7)
Glucose, Bld: 113 mg/dL (ref 65–139)
Potassium: 3.7 mmol/L (ref 3.5–5.3)
Sodium: 141 mmol/L (ref 135–146)
Total Bilirubin: 0.5 mg/dL (ref 0.2–1.2)
Total Protein: 7.6 g/dL (ref 6.1–8.1)
eGFR: 84 mL/min/{1.73_m2} (ref >=60)

## 2021-10-21 NOTE — Patient Outreach (Signed)
  Care Coordination [facility] Note Transition Care Management Follow-up Telephone Call Date of discharge and from where: 10/19/[facility] How have you been since you were released from the hospital? Patient states she is "doing alright". Any questions or concerns? No  Items Reviewed: Did the pt receive and understand the discharge instructions provided? Yes  Medications obtained and verified? Yes  Other? No  Any new allergies since your discharge? No  Dietary orders reviewed? No Do you have support at home? Yes   Home Care and Equipment/Supplies: Were home health services ordered? no If so, what is the name of the agency? N/A  Has the agency set up a time to come to the patient's home? not applicable Were any new equipment or medical supplies ordered?  No What is the name of the medical supply agency? N/A Were you able to get the supplies/equipment? not applicable Do you have any questions related to the use of the equipment or supplies? No  Functional Questionnaire: (I = Independent and D = Dependent) ADLs: I  Bathing/Dressing- I  Meal Prep- I  Eating- I  Maintaining continence- I  Transferring/Ambulation- I  Managing Meds- I  Follow up appointments reviewed:  PCP Hospital f/u appt confirmed? Yes  Scheduled to see NP Baity on 10/20/21 @ 1020. [facility] f/u appt confirmed? No   Are transportation arrangements needed? No  If their condition worsens, is the pt aware to call PCP or go to the Emergency Dept.? Yes Was the patient provided with contact information for the PCP's office or ED? Yes Was to pt encouraged to call back with questions or concerns? Yes  SDOH assessments and interventions completed:   No  Care Coordination Interventions Activated:  No Care Coordination Interventions:   N/A  Encounter Outcome:  Pt. Visit Completed  Emelia Loron RN, BSN [place] [phone] [email or web address]

## 2021-10-27 ENCOUNTER — Ambulatory Visit (INDEPENDENT_AMBULATORY_CARE_PROVIDER_SITE_OTHER): Payer: BC Managed Care – PPO | Admitting: Medical

## 2021-10-27 ENCOUNTER — Encounter: Payer: Self-pay | Admitting: Medical

## 2021-10-27 VITALS — BP 120/74 | HR 75 | Ht 60.0 in | Wt 161.0 lb

## 2021-10-27 DIAGNOSIS — I10 Essential (primary) hypertension: Secondary | ICD-10-CM

## 2021-10-27 DIAGNOSIS — E782 Mixed hyperlipidemia: Secondary | ICD-10-CM | POA: Diagnosis not present

## 2021-10-27 DIAGNOSIS — R079 Chest pain, unspecified: Secondary | ICD-10-CM | POA: Diagnosis not present

## 2021-10-27 DIAGNOSIS — R778 Other specified abnormalities of plasma proteins: Secondary | ICD-10-CM | POA: Diagnosis not present

## 2021-10-27 NOTE — Progress Notes (Signed)
Cardiology Office Note:    Date:  10/27/2021   ID:  Courtney Mccarty, DOB 1960/09/26, MRN [id number]  PCP:  Jearld Fenton, NP  [facility] Cardiologist:  None  [facility] Electrophysiologist:  None   Referring MD: Jearld Fenton, NP   Chief Complaint: Hospital follow-up  History of Present Illness:    Courtney Mccarty is a 61 y.o. female with a hx of HTN, HLD, DM2, obesity who presents for hospital follow-up.   Seen in 06/2020 as a new patient for chest pain and palpitations. Heart monitor and cardiac CTA were ordered.   Cardiac CTA showed calcium score of 0, no CAD. Heart monitor showed NSR with rare PACs and PVCs.  Recent ER visit for chest pain. HS trop went up to 51. She was given 4 baby Aspirin.   Today, heart monitor and cardiac CT were reviewed. She reports the day of the ER visit she was changing tires and was overexerting her self. She stood up and had chest tightness and SOB. Symptoms did not last too long. She recurrent denies chest pain and SOB. No LLE, orthopnea, or pnd. BP is good today.   Past Medical History:  Diagnosis Date   Essential hypertension 10/11/2016   Hyperlipidemia    Type 2 diabetes mellitus [facility])     Past Surgical History:  Procedure Laterality Date   BREAST BIOPSY Left 09/02/2021   stereo bx calcs, coil marker, path pending   BREAST BIOPSY Right 09/02/2021   stereo bx calcs, x marker, path pending   COLONOSCOPY WITH PROPOFOL N/A 11/28/2016   Procedure: COLONOSCOPY WITH PROPOFOL;  Surgeon: Lucilla Lame, MD;  Location: [place];  Service: Gastroenterology;  Laterality: N/A;   POLYPECTOMY N/A 11/28/2016   Procedure: POLYPECTOMY;  Surgeon: Lucilla Lame, MD;  Location: [place];  Service: Gastroenterology;  Laterality: N/A;    Current Medications: Current Meds  Medication Sig   cholecalciferol (VITAMIN D3) 25 MCG (1000 UNIT) tablet Take 1,000 Units by mouth daily.   glipiZIDE (GLUCOTROL) 5 MG tablet Take 1 tablet (5 mg  total) by mouth 2 (two) times daily before a meal.   hydrochlorothiazide (HYDRODIURIL) 12.5 MG tablet Take 1 tablet (12.5 mg total) by mouth daily.   lovastatin (MEVACOR) 10 MG tablet Take 1 tablet (10 mg total) by mouth at bedtime.   metFORMIN (GLUCOPHAGE-XR) 750 MG 24 hr tablet Take 1 tablet (750 mg total) by mouth 2 (two) times daily.   Multiple Vitamin (MULTIVITAMIN) tablet Take 1 tablet by mouth daily.     Allergies:   Patient has no known allergies.   Social History   Socioeconomic History   Marital status: Single    Spouse name: Not on file   Number of children: Not on file   Years of education: Not on file   Highest education level: Not on file  Occupational History   Not on file  Tobacco Use   Smoking status: Never   Smokeless tobacco: Never  Vaping Use   Vaping Use: Never used  Substance and Sexual Activity   Alcohol use: Yes    Alcohol/week: 12.0 standard drinks of alcohol    Types: 12 Cans of beer per week    Comment: weekly   Drug use: No   Sexual activity: Not Currently    Birth control/protection: None  Other Topics Concern   Not on file  Social History Narrative   Not on file   Social Determinants of Health   Financial Resource Strain: Not  on file  Food Insecurity: Not on file  Transportation Needs: Not on file  Physical Activity: Not on file  Stress: Not on file  Social Connections: Not on file     Family History: The patient's family history includes Cancer in her maternal grandfather and paternal grandfather; Depression in her mother; Diabetes in her father, maternal grandmother, mother, and paternal grandmother; Heart attack (age of onset: 31) in her mother; Hypertension in her father and mother.  ROS:   Please see the history of present illness.     All other systems reviewed and are negative.  EKGs/Labs/Other Studies Reviewed:    The following studies were reviewed today:  Cardiac CTA 07/2020 IMPRESSION: 1. Normal coronary calcium score  of 0. Patient is low risk for coronary events.   2. Normal coronary origin with right dominance.   3. No evidence of CAD.   4. CAD-RADS 0. Consider non-atherosclerotic causes of chest pain.   Electronically Signed: By: Kate Sable M.D. On: 07/16/2020 15:12    EKG:  EKG is ordered today.  The ekg ordered today demonstrates NSR 75bpm, LAD, no changes  Recent Labs: 05/05/2021: Hemoglobin 14.2; Platelets 241 10/20/2021: ALT 45; BUN 14; Creat 0.80; Potassium 3.7; Sodium 141  Recent Lipid Panel    Component Value Date/Time   CHOL 140 08/03/2021 0842   TRIG 159 (H) 08/03/2021 0842   HDL 39 (L) 08/03/2021 0842   CHOLHDL 3.6 08/03/2021 0842   VLDL 58 (H) 11/08/2016 0947   LDLCALC 75 08/03/2021 0842   Physical Exam:    VS:  BP 120/74 (BP Location: Left Arm, Patient Position: Sitting, Cuff Size: Normal)   Pulse 75   Ht 5' (1.524 m)   Wt 161 lb (73 kg)   LMP 04/05/2015 (Approximate)   SpO2 98%   BMI 31.44 kg/m     Wt Readings from Last 3 Encounters:  10/27/21 161 lb (73 kg)  10/20/21 163 lb (73.9 kg)  08/03/21 155 lb (70.3 kg)     GEN:  Well nourished, well developed in no acute distress HEENT: Normal NECK: No JVD; No carotid bruits LYMPHATICS: No lymphadenopathy CARDIAC: RRR, no murmurs, rubs, gallops RESPIRATORY:  Clear to auscultation without rales, wheezing or rhonchi  ABDOMEN: Soft, non-tender, non-distended MUSCULOSKELETAL:  No edema; No deformity  SKIN: Warm and dry NEUROLOGIC:  Alert and oriented x 3 PSYCHIATRIC:  Normal affect   ASSESSMENT:    1. Chest pain, unspecified type   2. Essential hypertension   3. Elevated troponin   4. Hyperlipidemia, mixed    PLAN:    In order of problems listed above:  Chest pain Elevated troponin Patient had an episode of chest pain while changing tires and went to the ER. HS trop elevated to 51. She felt she was overexerting her self that day. She denies recurrent chest pain. EKG with NSR and no significant  changes. Cardiac CTA in 2022 showed no CAD. We discussed Myoview stress test, but she would like to wait at this time.   HTN BP is good today. Continue current medications.  HLD LDL 43. Continue Lovastatin.  Disposition: Follow up in 6 month(s) with MD/APP     Signed, Valma Rotenberg Ninfa Meeker, PA-C  10/27/2021 12:12 PM    [place]

## 2021-10-27 NOTE — Patient Instructions (Signed)
Medication Instructions:  Your physician recommends that you continue on your current medications as directed. Please refer to the Current Medication list given to you today.  *If you need a refill on your cardiac medications before your next appointment, please call your pharmacy*   Lab Work: None ordered If you have labs (blood work) drawn today and your tests are completely normal, you will receive your results only by: [place] (if you have MyChart) OR A paper copy in the mail If you have any lab test that is abnormal or we need to change your treatment, we will call you to review the results.   Testing/Procedures: None ordered   Follow-Up: At [facility], you and your health needs are our priority.  As part of our continuing mission to provide you with exceptional heart care, we have created designated Provider Care Teams.  These Care Teams include your primary Cardiologist (physician) and Advanced Practice Providers (APPs -  Physician Assistants and Nurse Practitioners) who all work together to provide you with the care you need, when you need it.  We recommend signing up for the patient portal called "MyChart".  Sign up information is provided on this After Visit Summary.  MyChart is used to connect with patients for Virtual Visits (Telemedicine).  Patients are able to view lab/test results, encounter notes, upcoming appointments, etc.  Non-urgent messages can be sent to your provider as well.   To learn more about what you can do with MyChart, go to [email or web address].    Your next appointment:   Your physician wants you to follow-up in: 6 months You will receive a reminder letter in the mail two months in advance. If you don't receive a letter, please call our office to schedule the follow-up appointment.   The format for your next appointment:   In Person  Provider:   You may see Nelva Bush, MD or one of the following Advanced Practice Providers on your  designated Care Team:   Murray Hodgkins, NP Christell Faith, PA-C Cadence Kathlen Mody, [place]   Other Instructions N/A  Important Information About Sugar

## 2021-11-03 ENCOUNTER — Ambulatory Visit: Payer: BC Managed Care – PPO | Admitting: Internal Medicine

## 2021-11-08 ENCOUNTER — Encounter: Payer: Self-pay | Admitting: Internal Medicine

## 2021-11-08 ENCOUNTER — Ambulatory Visit: Payer: BC Managed Care – PPO | Admitting: Internal Medicine

## 2021-11-08 VITALS — BP 128/72 | HR 62 | Temp 97.1°F | Wt 160.0 lb

## 2021-11-08 DIAGNOSIS — Z0001 Encounter for general adult medical examination with abnormal findings: Secondary | ICD-10-CM | POA: Diagnosis not present

## 2021-11-08 DIAGNOSIS — Z6831 Body mass index (BMI) 31.0-31.9, adult: Secondary | ICD-10-CM

## 2021-11-08 DIAGNOSIS — K760 Fatty (change of) liver, not elsewhere classified: Secondary | ICD-10-CM | POA: Diagnosis not present

## 2021-11-08 DIAGNOSIS — E1165 Type 2 diabetes mellitus with hyperglycemia: Secondary | ICD-10-CM

## 2021-11-08 DIAGNOSIS — E785 Hyperlipidemia, unspecified: Secondary | ICD-10-CM

## 2021-11-08 DIAGNOSIS — E6609 Other obesity due to excess calories: Secondary | ICD-10-CM

## 2021-11-08 DIAGNOSIS — E1169 Type 2 diabetes mellitus with other specified complication: Secondary | ICD-10-CM | POA: Diagnosis not present

## 2021-11-08 DIAGNOSIS — I10 Essential (primary) hypertension: Secondary | ICD-10-CM | POA: Diagnosis not present

## 2021-11-08 LAB — POCT GLYCOSYLATED HEMOGLOBIN (HGB A1C): Hemoglobin A1C: 6.6 % — AB (ref 4.0–5.6)

## 2021-11-08 NOTE — Assessment & Plan Note (Signed)
Liver function reviewed Encourage diet and exercise for weight loss

## 2021-11-08 NOTE — Assessment & Plan Note (Signed)
Encourage diet and exercise for weight loss 

## 2021-11-08 NOTE — Assessment & Plan Note (Signed)
Controlled on HCTZ Reinforced DASH diet and exercise for weight loss Kidney function reviewed

## 2021-11-08 NOTE — Progress Notes (Signed)
Subjective:    Patient ID: Courtney Mccarty, female    DOB: 09/03/1960, 61 y.o.   MRN: [id number]  HPI  Pt presents to the clinic today for 3 month follow up of chronic conditions.  HTN: Her BP today is 128/72. She is taking HCTZ as prescribed. ECG from 06/2020 reviewed.  HLD: Her last LDL was 43, triglycerides 215, 10/2021. She is taking Lovastatin and Fish Oil OTC. She tries to consume a low fat diet.  DM 2: Her last A1C was 6.3%, 10/2021. She is taking Metformin and Glipizide as prescribed. She does not check her sugars. She checks her feet routinely. Her las eye exam was more than 1 year ago. Flu never. Pneumovax never. Covid never.  Fatty Liver: Her last AST/ALT was 73/54, 10/2021. She does not follow with GI.  Review of Systems     Past Medical History:  Diagnosis Date   Essential hypertension 10/11/2016   Hyperlipidemia    Type 2 diabetes mellitus (HCC)     Current Outpatient Medications  Medication Sig Dispense Refill   cholecalciferol (VITAMIN D3) 25 MCG (1000 UNIT) tablet Take 1,000 Units by mouth daily.     glipiZIDE (GLUCOTROL) 5 MG tablet Take 1 tablet (5 mg total) by mouth 2 (two) times daily before a meal. 180 tablet 1   hydrochlorothiazide (HYDRODIURIL) 12.5 MG tablet Take 1 tablet (12.5 mg total) by mouth daily. 90 tablet 0   lovastatin (MEVACOR) 10 MG tablet Take 1 tablet (10 mg total) by mouth at bedtime. 90 tablet 0   metFORMIN (GLUCOPHAGE-XR) 750 MG 24 hr tablet Take 1 tablet (750 mg total) by mouth 2 (two) times daily. 180 tablet 0   Multiple Vitamin (MULTIVITAMIN) tablet Take 1 tablet by mouth daily.     No current facility-administered medications for this visit.    No Known Allergies  Family History  Problem Relation Age of Onset   Diabetes Mother    Hypertension Mother    Depression Mother    Heart attack Mother 53   Diabetes Father    Hypertension Father    Diabetes Maternal Grandmother    Cancer Maternal Grandfather    Diabetes Paternal  Grandmother    Cancer Paternal Grandfather     Social History   Socioeconomic History   Marital status: Single    Spouse name: Not on file   Number of children: Not on file   Years of education: Not on file   Highest education level: Not on file  Occupational History   Not on file  Tobacco Use   Smoking status: Never   Smokeless tobacco: Never  Vaping Use   Vaping Use: Never used  Substance and Sexual Activity   Alcohol use: Yes    Alcohol/week: 12.0 standard drinks of alcohol    Types: 12 Cans of beer per week    Comment: weekly   Drug use: No   Sexual activity: Not Currently    Birth control/protection: None  Other Topics Concern   Not on file  Social History Narrative   Not on file   Social Determinants of Health   Financial Resource Strain: Not on file  Food Insecurity: Not on file  Transportation Needs: Not on file  Physical Activity: Not on file  Stress: Not on file  Social Connections: Not on file  Intimate Partner Violence: Not on file     Constitutional: Denies fever, malaise, fatigue, headache or abrupt weight changes.  HEENT: Denies eye pain, eye redness, ear pain,  ringing in the ears, wax buildup, runny nose, nasal congestion, bloody nose, or sore throat. Respiratory: Denies difficulty breathing, shortness of breath, cough or sputum production.   Cardiovascular: Denies chest pain, chest tightness, palpitations or swelling in the hands or feet.  Gastrointestinal: Denies abdominal pain, bloating, constipation, diarrhea or blood in the stool.  GU: Denies urgency, frequency, pain with urination, burning sensation, blood in urine, odor or discharge. Musculoskeletal: Denies decrease in range of motion, difficulty with gait, muscle pain or joint pain and swelling.  Skin: Denies redness, rashes, lesions or ulcercations.  Neurological: Denies dizziness, difficulty with memory, difficulty with speech or problems with balance and coordination.  Psych: Denies  anxiety, depression, SI/HI.  No other specific complaints in a complete review of systems (except as listed in HPI above).  Objective:   Physical Exam  BP 128/72 (BP Location: Left Arm, Patient Position: Sitting, Cuff Size: Normal)   Pulse 62   Temp (!) 97.1 F (36.2 C) (Temporal)   Wt 160 lb (72.6 kg)   LMP 04/05/2015 (Approximate)   SpO2 99%   BMI 31.25 kg/m   Wt Readings from Last 3 Encounters:  10/27/21 161 lb (73 kg)  10/20/21 163 lb (73.9 kg)  08/03/21 155 lb (70.3 kg)    General: Appears her stated age, obese, in NAD. Skin: Warm, dry and intact. No ulcerations noted. HEENT: Head: normal shape and size; Eyes: sclera white, no icterus, conjunctiva pink, PERRLA and EOMs intact;  Cardiovascular: Normal rate and rhythm. S1,S2 noted.  No murmur, rubs or gallops noted. No JVD or BLE edema. No carotid bruits noted. Pulmonary/Chest: Normal effort and positive vesicular breath sounds. No respiratory distress. No wheezes, rales or ronchi noted.  Abdomen: Soft and nontender. Normal bowel sounds.  Musculoskeletal: No difficulty with gait.  Neurological: Alert and oriented.    BMET    Component Value Date/Time   NA 141 10/20/2021 1034   NA 140 12/07/2018 1009   K 3.7 10/20/2021 1034   CL 104 10/20/2021 1034   CO2 24 10/20/2021 1034   GLUCOSE 113 10/20/2021 1034   BUN 14 10/20/2021 1034   BUN 12 12/07/2018 1009   CREATININE 0.80 10/20/2021 1034   CALCIUM 10.0 10/20/2021 1034   GFRNONAA 84 04/22/2020 0832   GFRAA 97 04/22/2020 0832    Lipid Panel     Component Value Date/Time   CHOL 140 08/03/2021 0842   TRIG 159 (H) 08/03/2021 0842   HDL 39 (L) 08/03/2021 0842   CHOLHDL 3.6 08/03/2021 0842   VLDL 58 (H) 11/08/2016 0947   LDLCALC 75 08/03/2021 0842    CBC    Component Value Date/Time   WBC 6.8 05/05/2021 0858   RBC 4.85 05/05/2021 0858   HGB 14.2 05/05/2021 0858   HCT 41.9 05/05/2021 0858   PLT 241 05/05/2021 0858   MCV 86.4 05/05/2021 0858   MCH 29.3  05/05/2021 0858   MCHC 33.9 05/05/2021 0858   RDW 12.5 05/05/2021 0858   LYMPHSABS 1,939 04/22/2020 0832   MONOABS 360 11/08/2016 0947   EOSABS 30 04/22/2020 0832   BASOSABS 22 04/22/2020 0832    Hgb A1C Lab Results  Component Value Date   HGBA1C 8.0 (H) 08/03/2021            Assessment & Plan:   RTC in 6 months for your annual exam  Webb Silversmith, NP

## 2021-11-08 NOTE — Assessment & Plan Note (Signed)
Lipid panel reviewed Advised her to continue lovastatin and fish oil

## 2021-11-08 NOTE — Patient Instructions (Signed)

## 2021-11-08 NOTE — Assessment & Plan Note (Signed)
POCT A1c 6.6% Will check urine microalbumin at annual exam Encouraged her to consume a low-carb diet and exercise for weight loss Continue metformin and glipizide Advised her to schedule an appointment for an eye exam Encouraged routine foot exams Encouraged her to get a flu shot in the fall She declines pneumonia vaccine She declines COVID-vaccine

## 2021-11-12 ENCOUNTER — Other Ambulatory Visit: Payer: Self-pay | Admitting: Internal Medicine

## 2021-11-12 NOTE — Telephone Encounter (Signed)
Requested Prescriptions  Pending Prescriptions Disp Refills  . lovastatin (MEVACOR) 10 MG tablet [Pharmacy Med Name: Lovastatin 10 MG Oral Tablet] 90 tablet 2    Sig: TAKE 1 TABLET BY MOUTH AT BEDTIME     Cardiovascular:  Antilipid - Statins 2 Failed - 11/12/2021  1:17 PM      Failed - Lipid Panel in normal range within the last 12 months    Cholesterol  Date Value Ref Range Status  08/03/2021 140 <200 mg/dL Final   LDL Cholesterol (Calc)  Date Value Ref Range Status  08/03/2021 75 mg/dL (calc) Final    Comment:    Reference range: <100 . Desirable range <100 mg/dL for primary prevention;   <70 mg/dL for patients with CHD or diabetic patients  with > or = 2 CHD risk factors. Marland Kitchen LDL-C is now calculated using the Martin-Hopkins  calculation, which is a validated novel method providing  better accuracy than the Friedewald equation in the  estimation of LDL-C.  Cresenciano Genre et al. Annamaria Helling. 7371;062(69): 2061-2068  (http://education.[employer].com/faq/FAQ164)    HDL  Date Value Ref Range Status  08/03/2021 39 (L) > OR = 50 mg/dL Final   Triglycerides  Date Value Ref Range Status  08/03/2021 159 (H) <150 mg/dL Final         Passed - Cr in normal range and within 360 days    Creat  Date Value Ref Range Status  10/20/2021 0.80 0.50 - 1.05 mg/dL Final   Creatinine, Urine  Date Value Ref Range Status  01/14/2021 105 20 - 275 mg/dL Final         Passed - Patient is not pregnant      Passed - Valid encounter within last 12 months    Recent Outpatient Visits          4 days ago Encounter for general adult medical examination with abnormal findings   [facility] [place], Coralie Keens, NP   3 weeks ago Chest pain, unspecified type   [facility] [place], Coralie Keens, NP   3 months ago Encounter for general adult medical examination with abnormal findings   [facility] [place], Coralie Keens, NP   5 months ago Elevated liver enzymes   [facility] [place], Coralie Keens, NP   6 months ago Encounter for screening mammogram for malignant neoplasm of breast   [facility] [place], Coralie Keens, NP

## 2021-11-29 ENCOUNTER — Ambulatory Visit: Payer: Self-pay

## 2021-11-29 NOTE — Telephone Encounter (Signed)
  Chief Complaint: breast pain Symptoms: L breast pain Frequency: 1 week Pertinent Negatives: Patient denies swelling, redness, or lump Disposition: '[]'$ ED /'[]'$ Urgent Care (no appt availability in office) / '[x]'$ Appointment(In office/virtual)/ '[]'$  [facility]/ '[]'$ Home Care/ '[]'$ Refused Recommended Disposition /'[]'$  Mobile Bus/ '[]'$  Follow-up with PCP Additional Notes: pt states she took Tylenol but hasnt helped with pain. Scheduled appt for 12/07/21 at 1120 with Courtney Fare, NP d/t first available appt pt is off work. Advised to CB if symptoms get worse.   Summary: left breast discomfort   The patient has had discomfort in their left breast for roughly a week   The patient shares that is a sore pain that is a 2:10   The patient would like to speak with a member of staff further when possible      Reason for Disposition  [1] Breast pain AND [2] cause is not known  Answer Assessment - Initial Assessment Questions 1. SYMPTOM: "What's the main symptom you're concerned about?"  (e.g., lump, pain, rash, nipple discharge)     Pain  2. LOCATION: "Where is the sx located?"     L breast, around nipple on L side  3. ONSET: "When did sx  start?"     1 week  6. OTHER SYMPTOMS: "Do you have any other symptoms?" (e.g., fever, breast pain, redness or rash, nipple discharge)     Breast pain  Protocols used: Breast Symptoms-A-AH

## 2021-12-07 ENCOUNTER — Ambulatory Visit: Payer: BC Managed Care – PPO | Admitting: Internal Medicine

## 2022-01-17 ENCOUNTER — Encounter: Payer: Self-pay | Admitting: Internal Medicine

## 2022-01-17 ENCOUNTER — Other Ambulatory Visit (HOSPITAL_COMMUNITY)
Admission: RE | Admit: 2022-01-17 | Discharge: 2022-01-17 | Disposition: A | Discharge status: Home / Self Care | Payer: BC Managed Care – PPO | Source: Ambulatory Visit | Attending: Internal Medicine | Admitting: Internal Medicine

## 2022-01-17 ENCOUNTER — Ambulatory Visit (INDEPENDENT_AMBULATORY_CARE_PROVIDER_SITE_OTHER): Payer: BC Managed Care – PPO | Admitting: Internal Medicine

## 2022-01-17 VITALS — BP 130/79 | HR 79 | Temp 96.8°F | Wt 166.0 lb

## 2022-01-17 DIAGNOSIS — N898 Other specified noninflammatory disorders of vagina: Secondary | ICD-10-CM

## 2022-01-17 NOTE — Patient Instructions (Signed)

## 2022-01-17 NOTE — Progress Notes (Signed)
Subjective:    Patient ID: Courtney Mccarty, female    DOB: September 04, 1960, 61 y.o.   MRN: [id number]  HPI  Patient presents to clinic today with complaint of vaginal odor.  She noticed this 1 week ago.  She denies vaginal discharge, abnormal bleeding, itching or rash.  She denies urinary urgency, frequency, dysuria, blood in her urine or pelvic pain.  She is not sexually active.  She is postmenopausal.  She has tried Monistat OTC with minimal relief of symptoms.  Review of Systems     Past Medical History:  Diagnosis Date   Essential hypertension 10/11/2016   Hyperlipidemia    Type 2 diabetes mellitus (HCC)     Current Outpatient Medications  Medication Sig Dispense Refill   cholecalciferol (VITAMIN D3) 25 MCG (1000 UNIT) tablet Take 1,000 Units by mouth daily.     glipiZIDE (GLUCOTROL) 5 MG tablet Take 1 tablet (5 mg total) by mouth 2 (two) times daily before a meal. 180 tablet 1   hydrochlorothiazide (HYDRODIURIL) 12.5 MG tablet Take 1 tablet (12.5 mg total) by mouth daily. 90 tablet 0   lovastatin (MEVACOR) 10 MG tablet TAKE 1 TABLET BY MOUTH AT BEDTIME 90 tablet 3   metFORMIN (GLUCOPHAGE-XR) 750 MG 24 hr tablet Take 1 tablet (750 mg total) by mouth 2 (two) times daily. 180 tablet 0   Multiple Vitamin (MULTIVITAMIN) tablet Take 1 tablet by mouth daily.     No current facility-administered medications for this visit.    No Known Allergies  Family History  Problem Relation Age of Onset   Diabetes Mother    Hypertension Mother    Depression Mother    Heart attack Mother 67   Diabetes Father    Hypertension Father    Diabetes Maternal Grandmother    Cancer Maternal Grandfather    Diabetes Paternal Grandmother    Cancer Paternal Grandfather     Social History   Socioeconomic History   Marital status: Single    Spouse name: Not on file   Number of children: Not on file   Years of education: Not on file   Highest education level: Not on file  Occupational History   Not  on file  Tobacco Use   Smoking status: Never   Smokeless tobacco: Never  Vaping Use   Vaping Use: Never used  Substance and Sexual Activity   Alcohol use: Yes    Alcohol/week: 12.0 standard drinks of alcohol    Types: 12 Cans of beer per week    Comment: weekly   Drug use: No   Sexual activity: Not Currently    Birth control/protection: None  Other Topics Concern   Not on file  Social History Narrative   Not on file   Social Determinants of Health   Financial Resource Strain: Not on file  Food Insecurity: Not on file  Transportation Needs: Not on file  Physical Activity: Not on file  Stress: Not on file  Social Connections: Not on file  Intimate Partner Violence: Not on file     Constitutional: Denies fever, malaise, fatigue, headache or abrupt weight changes.  Respiratory: Denies difficulty breathing, shortness of breath, cough or sputum production.   Cardiovascular: Denies chest pain, chest tightness, palpitations or swelling in the hands or feet.  Gastrointestinal: Denies abdominal pain, bloating, constipation, diarrhea or blood in the stool.  GU: Patient reports vaginal odor.  Denies urgency, frequency, pain with urination, burning sensation, blood in urine, or discharge.   No other specific  complaints in a complete review of systems (except as listed in HPI above).  Objective:   Physical Exam   BP 130/79   Pulse 79   Temp (!) 96.8 F (36 C) (Temporal)   Wt 166 lb (75.3 kg)   LMP 04/05/2015 (Approximate)   SpO2 96%   BMI 32.42 kg/m   Wt Readings from Last 3 Encounters:  11/08/21 160 lb (72.6 kg)  10/27/21 161 lb (73 kg)  10/20/21 163 lb (73.9 kg)    General: Appears her stated age, obese, in NAD. Cardiovascular: Normal rate and rhythm.  Pulmonary/Chest: Normal effort and positive vesicular breath sounds. No respiratory distress. No wheezes, rales or ronchi noted.  Pelvic: Self swab  Neurological: Alert and oriented.  BMET    Component Value  Date/Time   NA 141 10/20/2021 1034   NA 140 12/07/2018 1009   K 3.7 10/20/2021 1034   CL 104 10/20/2021 1034   CO2 24 10/20/2021 1034   GLUCOSE 113 10/20/2021 1034   BUN 14 10/20/2021 1034   BUN 12 12/07/2018 1009   CREATININE 0.80 10/20/2021 1034   CALCIUM 10.0 10/20/2021 1034   GFRNONAA 84 04/22/2020 0832   GFRAA 97 04/22/2020 0832    Lipid Panel     Component Value Date/Time   CHOL 140 08/03/2021 0842   TRIG 159 (H) 08/03/2021 0842   HDL 39 (L) 08/03/2021 0842   CHOLHDL 3.6 08/03/2021 0842   VLDL 58 (H) 11/08/2016 0947   LDLCALC 75 08/03/2021 0842    CBC    Component Value Date/Time   WBC 6.8 05/05/2021 0858   RBC 4.85 05/05/2021 0858   HGB 14.2 05/05/2021 0858   HCT 41.9 05/05/2021 0858   PLT 241 05/05/2021 0858   MCV 86.4 05/05/2021 0858   MCH 29.3 05/05/2021 0858   MCHC 33.9 05/05/2021 0858   RDW 12.5 05/05/2021 0858   LYMPHSABS 1,939 04/22/2020 0832   MONOABS 360 11/08/2016 0947   EOSABS 30 04/22/2020 0832   BASOSABS 22 04/22/2020 0832    Hgb A1C Lab Results  Component Value Date   HGBA1C 6.6 (A) 11/08/2021           Assessment & Plan:  Vaginal Odor:  We will obtain wet prep to check for BV and yeast She declines screening for STDs at this time  RTC in 4 months for follow-up of chronic conditions Webb Silversmith, NP

## 2022-01-19 LAB — CERVICOVAGINAL ANCILLARY ONLY
Bacterial Vaginitis (gardnerella): NEGATIVE
Candida Glabrata: NEGATIVE
Candida Vaginitis: NEGATIVE
Comment: NEGATIVE
Comment: NEGATIVE
Comment: NEGATIVE

## 2022-02-02 ENCOUNTER — Other Ambulatory Visit: Payer: Self-pay | Admitting: Internal Medicine

## 2022-02-02 DIAGNOSIS — I10 Essential (primary) hypertension: Secondary | ICD-10-CM

## 2022-02-02 MED ORDER — HYDROCHLOROTHIAZIDE 12.5 MG PO TABS: 12.5000 mg | ORAL_TABLET | Freq: Every day | ORAL | 0 refills | Status: DC

## 2022-02-02 NOTE — Telephone Encounter (Signed)
Medication Refill - Medication: hydrochlorothiazide (HYDRODIURIL) 12.5 MG tablet  Has the patient contacted their pharmacy? Yes.   (Agent: If no, request that the patient contact the pharmacy for the refill. If patient does not wish to contact the pharmacy document the reason why and proceed with request.) (Agent: If yes, when and what did the pharmacy advise?) CALLDR  Preferred Pharmacy (with phone number or street name):  [place], [place] - [address]  [address] [address]  Phone: [phone] Fax: [phone]  Hours: Not open 24 hours   Has the patient been seen for an appointment in the last year OR does the patient have an upcoming appointment? Yes.    Agent: Please be advised that RX refills may take up to 3 business days. We ask that you follow-up with your pharmacy.

## 2022-02-02 NOTE — Telephone Encounter (Signed)
Requested Prescriptions  Pending Prescriptions Disp Refills  . metFORMIN (GLUCOPHAGE-XR) 750 MG 24 hr tablet [Pharmacy Med Name: metFORMIN HCl ER 750 MG Oral Tablet Extended Release 24 Hour] 180 tablet 0    Sig: Take 1 tablet by mouth twice daily     Endocrinology:  Diabetes - Biguanides Failed - 02/02/2022  3:03 PM      Failed - B12 Level in normal range and within 720 days    No results found for: "VITAMINB12"       Failed - CBC within normal limits and completed in the last 12 months    WBC  Date Value Ref Range Status  05/05/2021 6.8 3.8 - 10.8 Thousand/uL Final   RBC  Date Value Ref Range Status  05/05/2021 4.85 3.80 - 5.10 Million/uL Final   Hemoglobin  Date Value Ref Range Status  05/05/2021 14.2 11.7 - 15.5 g/dL Final   HCT  Date Value Ref Range Status  05/05/2021 41.9 35.0 - 45.0 % Final   MCHC  Date Value Ref Range Status  05/05/2021 33.9 32.0 - 36.0 g/dL Final   [facility]  Date Value Ref Range Status  05/05/2021 29.3 27.0 - 33.0 pg Final   MCV  Date Value Ref Range Status  05/05/2021 86.4 80.0 - 100.0 fL Final   No results found for: "PLTCOUNTKUC", "LABPLAT", "POCPLA" RDW  Date Value Ref Range Status  05/05/2021 12.5 11.0 - 15.0 % Final         Passed - Cr in normal range and within 360 days    Creat  Date Value Ref Range Status  10/20/2021 0.80 0.50 - 1.05 mg/dL Final   Creatinine, Urine  Date Value Ref Range Status  01/14/2021 105 20 - 275 mg/dL Final         Passed - HBA1C is between 0 and 7.9 and within 180 days    Hemoglobin A1C  Date Value Ref Range Status  11/08/2021 6.6 (A) 4.0 - 5.6 % Final   Hgb A1c MFr Bld  Date Value Ref Range Status  08/03/2021 8.0 (H) <5.7 % of total Hgb Final    Comment:    For someone without known diabetes, a hemoglobin A1c value of 6.5% or greater indicates that they may have  diabetes and this should be confirmed with a follow-up  test. . For someone with known diabetes, a value <7% indicates  that their  diabetes is well controlled and a value  greater than or equal to 7% indicates suboptimal  control. A1c targets should be individualized based on  duration of diabetes, age, comorbid conditions, and  other considerations. . Currently, no consensus exists regarding use of hemoglobin A1c for diagnosis of diabetes for children. .          Passed - eGFR in normal range and within 360 days    GFR, Est African American  Date Value Ref Range Status  04/22/2020 97 > OR = 60 mL/min/1.5m Final   GFR, Est Non African American  Date Value Ref Range Status  04/22/2020 84 > OR = 60 mL/min/1.735mFinal   eGFR  Date Value Ref Range Status  10/20/2021 84 > OR = 60 mL/min/1.7388minal    Comment:    The eGFR is based on the CKD-EPI 2021 equation. To calculate  the new eGFR from a previous Creatinine or Cystatin C result, go to https://www.kidney.org/professionals/ kdoqi/gfr%5Fcalculator          Passed - Valid encounter within last 6 months  Recent Outpatient Visits          2 weeks ago Vaginal odor   [facility] [place], Coralie Keens, NP   2 months ago Encounter for general adult medical examination with abnormal findings   [facility] [place], Coralie Keens, NP   3 months ago Chest pain, unspecified type   [facility] [place], Coralie Keens, NP   6 months ago Encounter for general adult medical examination with abnormal findings   [facility] [place], Coralie Keens, NP   7 months ago Elevated liver enzymes   [facility] [place], [place], NP             . glipiZIDE (GLUCOTROL) 5 MG tablet [Pharmacy Med Name: glipiZIDE 5 MG Oral Tablet] 180 tablet 0    Sig: TAKE 1 TABLET BY MOUTH TWICE DAILY BEFORE A MEAL     Endocrinology:  Diabetes - Sulfonylureas Passed - 02/02/2022  3:03 PM      Passed - HBA1C is between 0 and 7.9 and within 180 days    Hemoglobin A1C  Date Value Ref Range Status  11/08/2021 6.6 (A) 4.0 - 5.6 % Final    Hgb A1c MFr Bld  Date Value Ref Range Status  08/03/2021 8.0 (H) <5.7 % of total Hgb Final    Comment:    For someone without known diabetes, a hemoglobin A1c value of 6.5% or greater indicates that they may have  diabetes and this should be confirmed with a follow-up  test. . For someone with known diabetes, a value <7% indicates  that their diabetes is well controlled and a value  greater than or equal to 7% indicates suboptimal  control. A1c targets should be individualized based on  duration of diabetes, age, comorbid conditions, and  other considerations. . Currently, no consensus exists regarding use of hemoglobin A1c for diagnosis of diabetes for children. .          Passed - Cr in normal range and within 360 days    Creat  Date Value Ref Range Status  10/20/2021 0.80 0.50 - 1.05 mg/dL Final   Creatinine, Urine  Date Value Ref Range Status  01/14/2021 105 20 - 275 mg/dL Final         Passed - Valid encounter within last 6 months    Recent Outpatient Visits          2 weeks ago Vaginal odor   [facility] [place], Coralie Keens, NP   2 months ago Encounter for general adult medical examination with abnormal findings   [facility] [place], Coralie Keens, NP   3 months ago Chest pain, unspecified type   [facility] [place], Coralie Keens, NP   6 months ago Encounter for general adult medical examination with abnormal findings   [facility] [place], Coralie Keens, NP   7 months ago Elevated liver enzymes   [facility] [place], Coralie Keens, [place]

## 2022-02-02 NOTE — Telephone Encounter (Signed)
Requested Prescriptions  Pending Prescriptions Disp Refills  . hydrochlorothiazide (HYDRODIURIL) 12.5 MG tablet 90 tablet 0    Sig: Take 1 tablet (12.5 mg total) by mouth daily.     Cardiovascular: Diuretics - Thiazide Passed - 02/02/2022  3:19 PM      Passed - Cr in normal range and within 180 days    Creat  Date Value Ref Range Status  10/20/2021 0.80 0.50 - 1.05 mg/dL Final   Creatinine, Urine  Date Value Ref Range Status  01/14/2021 105 20 - 275 mg/dL Final         Passed - K in normal range and within 180 days    Potassium  Date Value Ref Range Status  10/20/2021 3.7 3.5 - 5.3 mmol/L Final         Passed - Na in normal range and within 180 days    Sodium  Date Value Ref Range Status  10/20/2021 141 135 - 146 mmol/L Final  12/07/2018 140 134 - 144 mmol/L Final         Passed - Last BP in normal range    BP Readings from Last 1 Encounters:  01/17/22 130/79         Passed - Valid encounter within last 6 months    Recent Outpatient Visits          2 weeks ago Vaginal odor   [facility] [place], Coralie Keens, NP   2 months ago Encounter for general adult medical examination with abnormal findings   [facility] [place], Coralie Keens, NP   3 months ago Chest pain, unspecified type   [facility] [place], Coralie Keens, NP   6 months ago Encounter for general adult medical examination with abnormal findings   [facility] [place], Coralie Keens, NP   7 months ago Elevated liver enzymes   [facility] [place], Coralie Keens, [place]

## 2022-03-04 ENCOUNTER — Ambulatory Visit: Payer: BC Managed Care – PPO | Admitting: Internal Medicine

## 2022-03-08 ENCOUNTER — Encounter: Payer: Self-pay | Admitting: Internal Medicine

## 2022-03-08 ENCOUNTER — Ambulatory Visit: Payer: BC Managed Care – PPO | Admitting: Internal Medicine

## 2022-03-08 VITALS — BP 148/62 | HR 71 | Temp 96.7°F | Wt 163.0 lb

## 2022-03-08 DIAGNOSIS — I10 Essential (primary) hypertension: Secondary | ICD-10-CM | POA: Diagnosis not present

## 2022-03-08 DIAGNOSIS — R519 Headache, unspecified: Secondary | ICD-10-CM | POA: Diagnosis not present

## 2022-03-08 DIAGNOSIS — Z6831 Body mass index (BMI) 31.0-31.9, adult: Secondary | ICD-10-CM | POA: Diagnosis not present

## 2022-03-08 DIAGNOSIS — E6609 Other obesity due to excess calories: Secondary | ICD-10-CM | POA: Diagnosis not present

## 2022-03-08 MED ORDER — LISINOPRIL-HYDROCHLOROTHIAZIDE 10-12.5 MG PO TABS: 1.0000 | ORAL_TABLET | Freq: Every day | ORAL | 0 refills | Status: DC

## 2022-03-08 NOTE — Assessment & Plan Note (Signed)
Encourage diet and exercise for weight loss 

## 2022-03-08 NOTE — Progress Notes (Signed)
Subjective:    Patient ID: Courtney Mccarty, female    DOB: 12/03/1960, 61 y.o.   MRN: [id number]  HPI  Patient presents to clinic today with complaint of headache. The headache is located in the right side of her forehead. She described the pain as pulsating. She denies blurred vision or dizziness. She denies runny nose, nasal congestion, ear pain, sore throat, cough or shortness of breath. Of note, her BP today is 145/69. She is prescribed HCTZ but admits that she did not take it this morning.   Review of Systems     Past Medical History:  Diagnosis Date   Essential hypertension 10/11/2016   Hyperlipidemia    Type 2 diabetes mellitus (HCC)     Current Outpatient Medications  Medication Sig Dispense Refill   cholecalciferol (VITAMIN D3) 25 MCG (1000 UNIT) tablet Take 1,000 Units by mouth daily.     glipiZIDE (GLUCOTROL) 5 MG tablet TAKE 1 TABLET BY MOUTH TWICE DAILY BEFORE A MEAL 180 tablet 0   hydrochlorothiazide (HYDRODIURIL) 12.5 MG tablet Take 1 tablet (12.5 mg total) by mouth daily. 90 tablet 0   lovastatin (MEVACOR) 10 MG tablet TAKE 1 TABLET BY MOUTH AT BEDTIME 90 tablet 3   metFORMIN (GLUCOPHAGE-XR) 750 MG 24 hr tablet Take 1 tablet by mouth twice daily 180 tablet 0   Multiple Vitamin (MULTIVITAMIN) tablet Take 1 tablet by mouth daily.     No current facility-administered medications for this visit.    No Known Allergies  Family History  Problem Relation Age of Onset   Diabetes Mother    Hypertension Mother    Depression Mother    Heart attack Mother 63   Diabetes Father    Hypertension Father    Diabetes Maternal Grandmother    Cancer Maternal Grandfather    Diabetes Paternal Grandmother    Cancer Paternal Grandfather     Social History   Socioeconomic History   Marital status: Single    Spouse name: Not on file   Number of children: Not on file   Years of education: Not on file   Highest education level: Not on file  Occupational History   Not on file   Tobacco Use   Smoking status: Never   Smokeless tobacco: Never  Vaping Use   Vaping Use: Never used  Substance and Sexual Activity   Alcohol use: Yes    Alcohol/week: 12.0 standard drinks of alcohol    Types: 12 Cans of beer per week    Comment: weekly   Drug use: No   Sexual activity: Not Currently    Birth control/protection: None  Other Topics Concern   Not on file  Social History Narrative   Not on file   Social Determinants of Health   Financial Resource Strain: Not on file  Food Insecurity: Not on file  Transportation Needs: Not on file  Physical Activity: Not on file  Stress: Not on file  Social Connections: Not on file  Intimate Partner Violence: Not on file     Constitutional: Patient reports headache.  Denies fever, malaise, fatigue, or abrupt weight changes.  HEENT: Denies eye pain, eye redness, ear pain, ringing in the ears, wax buildup, runny nose, nasal congestion, bloody nose, or sore throat. Respiratory: Denies difficulty breathing, shortness of breath, cough or sputum production.   Cardiovascular: Denies chest pain, chest tightness, palpitations or swelling in the hands or feet.  Musculoskeletal: Denies decrease in range of motion, difficulty with gait, muscle pain or joint  pain and swelling.   Neurological: Denies dizziness, difficulty with memory, difficulty with speech or problems   No other specific complaints in a complete review of systems (except as listed in HPI above).  Objective:   Physical Exam  BP (!) 148/62 (BP Location: Right Arm, Patient Position: Sitting, Cuff Size: Normal)   Pulse 71   Temp (!) 96.7 F (35.9 C) (Temporal)   Wt 163 lb (73.9 kg)   LMP 04/05/2015 (Approximate)   SpO2 100%   BMI 31.83 kg/m   Wt Readings from Last 3 Encounters:  01/17/22 166 lb (75.3 kg)  11/08/21 160 lb (72.6 kg)  10/27/21 161 lb (73 kg)    General: Appears her stated age, obese, in NAD. HEENT: Head: normal shape and size; Eyes: sclera white,  no icterus, conjunctiva pink, PERRLA and EOMs intact;  Cardiovascular: Normal rate and rhythm. S1,S2 noted.  No murmur, rubs or gallops noted. No JVD or BLE edema.  Pulmonary/Chest: Normal effort and positive vesicular breath sounds. No respiratory distress. No wheezes, rales or ronchi noted.  Musculoskeletal: No difficulty with gait.  Neurological: Alert and oriented.  Coordination normal.     BMET    Component Value Date/Time   NA 141 10/20/2021 1034   NA 140 12/07/2018 1009   K 3.7 10/20/2021 1034   CL 104 10/20/2021 1034   CO2 24 10/20/2021 1034   GLUCOSE 113 10/20/2021 1034   BUN 14 10/20/2021 1034   BUN 12 12/07/2018 1009   CREATININE 0.80 10/20/2021 1034   CALCIUM 10.0 10/20/2021 1034   GFRNONAA 84 04/22/2020 0832   GFRAA 97 04/22/2020 0832    Lipid Panel     Component Value Date/Time   CHOL 140 08/03/2021 0842   TRIG 159 (H) 08/03/2021 0842   HDL 39 (L) 08/03/2021 0842   CHOLHDL 3.6 08/03/2021 0842   VLDL 58 (H) 11/08/2016 0947   LDLCALC 75 08/03/2021 0842    CBC    Component Value Date/Time   WBC 6.8 05/05/2021 0858   RBC 4.85 05/05/2021 0858   HGB 14.2 05/05/2021 0858   HCT 41.9 05/05/2021 0858   PLT 241 05/05/2021 0858   MCV 86.4 05/05/2021 0858   MCH 29.3 05/05/2021 0858   MCHC 33.9 05/05/2021 0858   RDW 12.5 05/05/2021 0858   LYMPHSABS 1,939 04/22/2020 0832   MONOABS 360 11/08/2016 0947   EOSABS 30 04/22/2020 0832   BASOSABS 22 04/22/2020 0832    Hgb A1C Lab Results  Component Value Date   HGBA1C 6.6 (A) 11/08/2021           Assessment & Plan:      RTC in 2 months for follow-up of chronic conditions Webb Silversmith, NP

## 2022-03-08 NOTE — Assessment & Plan Note (Signed)
Uncontrolled on HCTZ, will D/C Rx for lisinopril-HCT 10-12.5 mg daily Reinforced DASH diet and exercise for weight loss

## 2022-03-08 NOTE — Patient Instructions (Signed)
How to Take Your Blood Pressure Blood pressure measures how strongly your blood is pressing against the walls of your arteries. Arteries are blood vessels that carry blood from your heart throughout your body. You can take your blood pressure at home with a machine. You may need to check your blood pressure at home: To check if you have high blood pressure (hypertension). To check your blood pressure over time. To make sure your blood pressure medicine is working. Supplies needed: Blood pressure machine, or monitor. A chair to sit in. This should be a chair where you can sit upright with your back supported. Do not sit on a soft couch or an armchair. Table or desk. Small notebook. Pencil or pen. How to prepare Avoid these things for 30 minutes before checking your blood pressure: Having drinks with caffeine in them, such as coffee or tea. Drinking alcohol. Eating. Smoking. Exercising. Do these things five minutes before checking your blood pressure: Go to the bathroom and pee (urinate). Sit in a chair. Be quiet. Do not talk. How to take your blood pressure Follow the instructions that came with your machine. If you have a digital blood pressure monitor, these may be the instructions: Sit up straight. Place your feet on the floor. Do not cross your ankles or legs. Rest your left arm at the level of your heart. You may rest it on a table, desk, or chair. Pull up your shirt sleeve. Wrap the blood pressure cuff around the upper part of your left arm. The cuff should be 1 inch (2.5 cm) above your elbow. It is best to wrap the cuff around bare skin. Fit the cuff snugly around your arm, but not too tightly. You should be able to place only one finger between the cuff and your arm. Place the cord so that it rests in the bend of your elbow. Press the power button. Sit quietly while the cuff fills with air and loses air. Write down the numbers on the screen. Wait 2-3 minutes and then repeat  steps 1-10. What do the numbers mean? Two numbers make up your blood pressure. The first number is called systolic pressure. The second is called diastolic pressure. An example of a blood pressure reading is "120 over 80" (or 120/80). If you are an adult and do not have a medical condition, use this guide to find out if your blood pressure is normal: Normal First number: below 120. Second number: below 80. Elevated First number: 120-129. Second number: below 80. Hypertension stage 1 First number: 130-139. Second number: 80-89. Hypertension stage 2 First number: 140 or above. Second number: 90 or above. Your blood pressure is above normal even if only the first or only the second number is above normal. Follow these instructions at home: Medicines Take over-the-counter and prescription medicines only as told by your doctor. Tell your doctor if your medicine is causing side effects. General instructions Check your blood pressure as often as your doctor tells you to. Check your blood pressure at the same time every day. Take your monitor to your next doctor's appointment. Your doctor will: Make sure you are using it correctly. Make sure it is working right. Understand what your blood pressure numbers should be. Keep all follow-up visits. General tips You will need a blood pressure machine or monitor. Your doctor can suggest a monitor. You can buy one at a drugstore or online. When choosing one: Choose one with an arm cuff. Choose one that wraps around your   upper arm. Only one finger should fit between your arm and the cuff. Do not choose one that measures your blood pressure from your wrist or finger. Where to find more information American Heart Association: www.heart.org Contact a doctor if: Your blood pressure keeps being high. Your blood pressure is suddenly low. Get help right away if: Your first blood pressure number is higher than 180. Your second blood pressure number is  higher than 120. These symptoms may be an emergency. Do not wait to see if the symptoms will go away. Get help right away. Call 911. Summary Check your blood pressure at the same time every day. Avoid caffeine, alcohol, smoking, and exercise for 30 minutes before checking your blood pressure. Make sure you understand what your blood pressure numbers should be. This information is not intended to replace advice given to you by your health care provider. Make sure you discuss any questions you have with your health care provider. Document Revised: 12/03/2020 Document Reviewed: 12/03/2020 Elsevier Patient Education  2023 Elsevier Inc.  

## 2022-03-21 ENCOUNTER — Ambulatory Visit (INDEPENDENT_AMBULATORY_CARE_PROVIDER_SITE_OTHER): Payer: BC Managed Care – PPO | Admitting: Internal Medicine

## 2022-03-21 ENCOUNTER — Encounter: Payer: Self-pay | Admitting: Internal Medicine

## 2022-03-21 VITALS — BP 128/74 | HR 67 | Temp 96.4°F | Wt 166.0 lb

## 2022-03-21 DIAGNOSIS — Z6832 Body mass index (BMI) 32.0-32.9, adult: Secondary | ICD-10-CM | POA: Diagnosis not present

## 2022-03-21 DIAGNOSIS — E6609 Other obesity due to excess calories: Secondary | ICD-10-CM

## 2022-03-21 DIAGNOSIS — I10 Essential (primary) hypertension: Secondary | ICD-10-CM

## 2022-03-21 NOTE — Assessment & Plan Note (Signed)
Encourage diet and exercise for weight loss 

## 2022-03-21 NOTE — Patient Instructions (Signed)

## 2022-03-21 NOTE — Assessment & Plan Note (Signed)
Controlled on lisinopril HCT Reinforced DASH diet and exercise for weight loss

## 2022-03-21 NOTE — Progress Notes (Signed)
Subjective:    Patient ID: Courtney Mccarty, female    DOB: 1960-04-20, 61 y.o.   MRN: [id number]  HPI  Patient presents to clinic today for 2-week follow-up of HTN.  At her last visit, she was changed to Lisinopril HCT.  She has been taking the medication as prescribed.  Her BP today is 128/74.  ECG from 10/2021 reviewed.  Review of Systems     Past Medical History:  Diagnosis Date   Essential hypertension 10/11/2016   Hyperlipidemia    Type 2 diabetes mellitus (HCC)     Current Outpatient Medications  Medication Sig Dispense Refill   cholecalciferol (VITAMIN D3) 25 MCG (1000 UNIT) tablet Take 1,000 Units by mouth daily.     glipiZIDE (GLUCOTROL) 5 MG tablet TAKE 1 TABLET BY MOUTH TWICE DAILY BEFORE A MEAL 180 tablet 0   lisinopril-hydrochlorothiazide (ZESTORETIC) 10-12.5 MG tablet Take 1 tablet by mouth daily. 90 tablet 0   lovastatin (MEVACOR) 10 MG tablet TAKE 1 TABLET BY MOUTH AT BEDTIME 90 tablet 3   metFORMIN (GLUCOPHAGE-XR) 750 MG 24 hr tablet Take 1 tablet by mouth twice daily 180 tablet 0   Multiple Vitamin (MULTIVITAMIN) tablet Take 1 tablet by mouth daily. (Patient not taking: Reported on 03/08/2022)     No current facility-administered medications for this visit.    No Known Allergies  Family History  Problem Relation Age of Onset   Diabetes Mother    Hypertension Mother    Depression Mother    Heart attack Mother 28   Diabetes Father    Hypertension Father    Diabetes Maternal Grandmother    Cancer Maternal Grandfather    Diabetes Paternal Grandmother    Cancer Paternal Grandfather     Social History   Socioeconomic History   Marital status: Single    Spouse name: Not on file   Number of children: Not on file   Years of education: Not on file   Highest education level: Not on file  Occupational History   Not on file  Tobacco Use   Smoking status: Never   Smokeless tobacco: Never  Vaping Use   Vaping Use: Never used  Substance and Sexual Activity    Alcohol use: Yes    Alcohol/week: 12.0 standard drinks of alcohol    Types: 12 Cans of beer per week    Comment: weekly   Drug use: No   Sexual activity: Not Currently    Birth control/protection: None  Other Topics Concern   Not on file  Social History Narrative   Not on file   Social Determinants of Health   Financial Resource Strain: Not on file  Food Insecurity: Not on file  Transportation Needs: Not on file  Physical Activity: Not on file  Stress: Not on file  Social Connections: Not on file  Intimate Partner Violence: Not on file     Constitutional: Denies fever, malaise, fatigue, headache or abrupt weight changes.  Respiratory: Denies difficulty breathing, shortness of breath, cough or sputum production.   Cardiovascular: Denies chest pain, chest tightness, palpitations or swelling in the hands or feet.  Musculoskeletal: Denies decrease in range of motion, difficulty with gait, muscle pain or joint pain and swelling.  Skin: Denies redness, rashes, lesions or ulcercations.  Neurological: Denies dizziness, difficulty with memory, difficulty with speech or problems with balance and coordination.   No other specific complaints in a complete review of systems (except as listed in HPI above).  Objective:   Physical Exam  BP 128/74 (BP Location: Left Arm, Patient Position: Sitting, Cuff Size: Normal)   Pulse 67   Temp (!) 96.4 F (35.8 C) (Temporal)   Wt 166 lb (75.3 kg)   LMP 04/05/2015 (Approximate)   SpO2 98%   BMI 32.42 kg/m   Wt Readings from Last 3 Encounters:  03/08/22 163 lb (73.9 kg)  01/17/22 166 lb (75.3 kg)  11/08/21 160 lb (72.6 kg)    General: Appears her stated age, obese in NAD. HEENT: Head: normal shape and size; Eyes: sclera white, no icterus, conjunctiva pink, PERRLA and EOMs intact;  Cardiovascular: Normal rate and rhythm. S1,S2 noted.  No murmur, rubs or gallops noted. No JVD or BLE edema.  Pulmonary/Chest: Normal effort and positive  vesicular breath sounds. No respiratory distress. No wheezes, rales or ronchi noted.  Musculoskeletal: No difficulty with gait.  Neurological: Alert and oriented.  Coordination normal.     BMET    Component Value Date/Time   NA 141 10/20/2021 1034   NA 140 12/07/2018 1009   K 3.7 10/20/2021 1034   CL 104 10/20/2021 1034   CO2 24 10/20/2021 1034   GLUCOSE 113 10/20/2021 1034   BUN 14 10/20/2021 1034   BUN 12 12/07/2018 1009   CREATININE 0.80 10/20/2021 1034   CALCIUM 10.0 10/20/2021 1034   GFRNONAA 84 04/22/2020 0832   GFRAA 97 04/22/2020 0832    Lipid Panel     Component Value Date/Time   CHOL 140 08/03/2021 0842   TRIG 159 (H) 08/03/2021 0842   HDL 39 (L) 08/03/2021 0842   CHOLHDL 3.6 08/03/2021 0842   VLDL 58 (H) 11/08/2016 0947   LDLCALC 75 08/03/2021 0842    CBC    Component Value Date/Time   WBC 6.8 05/05/2021 0858   RBC 4.85 05/05/2021 0858   HGB 14.2 05/05/2021 0858   HCT 41.9 05/05/2021 0858   PLT 241 05/05/2021 0858   MCV 86.4 05/05/2021 0858   MCH 29.3 05/05/2021 0858   MCHC 33.9 05/05/2021 0858   RDW 12.5 05/05/2021 0858   LYMPHSABS 1,939 04/22/2020 0832   MONOABS 360 11/08/2016 0947   EOSABS 30 04/22/2020 0832   BASOSABS 22 04/22/2020 0832    Hgb A1C Lab Results  Component Value Date   HGBA1C 6.6 (A) 11/08/2021           Assessment & Plan:     RTC in 2 months for follow-up of chronic conditions Webb Silversmith, NP

## 2022-05-23 ENCOUNTER — Encounter: Payer: Self-pay | Admitting: Physician Assistant

## 2022-05-23 ENCOUNTER — Ambulatory Visit: Payer: BC Managed Care – PPO | Admitting: Physician Assistant

## 2022-05-23 VITALS — BP 106/64 | HR 74 | Temp 96.2°F | Wt 163.0 lb

## 2022-05-23 DIAGNOSIS — E785 Hyperlipidemia, unspecified: Secondary | ICD-10-CM | POA: Diagnosis not present

## 2022-05-23 DIAGNOSIS — I10 Essential (primary) hypertension: Secondary | ICD-10-CM

## 2022-05-23 DIAGNOSIS — E1169 Type 2 diabetes mellitus with other specified complication: Secondary | ICD-10-CM | POA: Diagnosis not present

## 2022-05-23 DIAGNOSIS — E1165 Type 2 diabetes mellitus with hyperglycemia: Secondary | ICD-10-CM | POA: Diagnosis not present

## 2022-05-23 NOTE — Assessment & Plan Note (Signed)
Chronic, historic condition Currently managed with Lovastatin 10 mg PO QHS - she appears to be tolerating this well Recheck lipid panel today  Follow up in 6 months for regular monitoring

## 2022-05-23 NOTE — Progress Notes (Signed)
Established Patient Office Visit  Name: Courtney Mccarty   MRN: [id number]    DOB: March 24, 1961   Date:05/23/2022  Today's Provider: Talitha Givens, MHS, PA-C Introduced myself to the patient as a PA-C and provided education on APPs in clinical practice.         Subjective  Chief Complaint  Chief Complaint  Patient presents with   Hyperlipidemia   Hypertension   Diabetes    HPI   Diabetes, Type 2 - Last A1c : 6.6 - Medications: Metformin 750 mg PO BID, Glipizide 5 mg PO BID,  - Compliance: good compliance  - Checking BG at home: no - Diet: She is reducing her salt intake in her diet  - Exercise: reports she is active at work  - Eye exam: Discussed having yearly eye exams for screening  - Foot exam: UTD - Microalbumin: ordered today  - Statin: On statin therapy  - PNA vaccine: Discussed importance of vaccines in those with higher risk  - Denies symptoms of hypoglycemia, polyuria, polydipsia, numbness extremities, foot ulcers/trauma  HYPERTENSION / [place] Satisfied with current treatment? yes Duration of hypertension: years BP monitoring frequency: not checking BP range:  BP medication side effects: no Past BP meds: lisinopril-HCTZ Duration of hyperlipidemia: chronic Cholesterol medication side effects: no Cholesterol supplements: none Past cholesterol medications: lovastatin (mevacor) Medication compliance: excellent compliance Aspirin: no Recent stressors: no Recurrent headaches: no Visual changes: no Palpitations: no Dyspnea: no Chest pain: no Lower extremity edema: no Dizzy/lightheaded: no   Patient Active Problem List   Diagnosis Date Noted   Fatty liver 01/14/2021   Class 1 obesity due to excess calories with body mass index (BMI) of 32.0 to 32.9 in adult 01/14/2021   Hyperlipidemia associated with type 2 diabetes mellitus ([place]) 06/25/2020   Type 2 diabetes mellitus with hyperglycemia, without long-term current use of insulin ([place])  04/27/2020   Essential hypertension 10/11/2016    Past Surgical History:  Procedure Laterality Date   BREAST BIOPSY Left 09/02/2021   stereo bx calcs, coil marker, path pending   BREAST BIOPSY Right 09/02/2021   stereo bx calcs, x marker, path pending   COLONOSCOPY WITH PROPOFOL N/A 11/28/2016   Procedure: COLONOSCOPY WITH PROPOFOL;  Surgeon: Lucilla Lame, MD;  Location: [place];  Service: Gastroenterology;  Laterality: N/A;   POLYPECTOMY N/A 11/28/2016   Procedure: POLYPECTOMY;  Surgeon: Lucilla Lame, MD;  Location: [place];  Service: Gastroenterology;  Laterality: N/A;    Family History  Problem Relation Age of Onset   Diabetes Mother    Hypertension Mother    Depression Mother    Heart attack Mother 4   Diabetes Father    Hypertension Father    Diabetes Maternal Grandmother    Cancer Maternal Grandfather    Diabetes Paternal Grandmother    Cancer Paternal Grandfather     Social History   Tobacco Use   Smoking status: Never   Smokeless tobacco: Never  Substance Use Topics   Alcohol use: Yes    Alcohol/week: 12.0 standard drinks of alcohol    Types: 12 Cans of beer per week    Comment: weekly     Current Outpatient Medications:    cholecalciferol (VITAMIN D3) 25 MCG (1000 UNIT) tablet, Take 1,000 Units by mouth daily., Disp: , Rfl:    glipiZIDE (GLUCOTROL) 5 MG tablet, TAKE 1 TABLET BY MOUTH TWICE DAILY BEFORE A MEAL, Disp: 180 tablet, Rfl: 0   lisinopril-hydrochlorothiazide (ZESTORETIC) 10-12.5  MG tablet, Take 1 tablet by mouth daily., Disp: 90 tablet, Rfl: 0   lovastatin (MEVACOR) 10 MG tablet, TAKE 1 TABLET BY MOUTH AT BEDTIME, Disp: 90 tablet, Rfl: 3   metFORMIN (GLUCOPHAGE-XR) 750 MG 24 hr tablet, Take 1 tablet by mouth twice daily, Disp: 180 tablet, Rfl: 0   Multiple Vitamin (MULTIVITAMIN) tablet, Take 1 tablet by mouth daily., Disp: , Rfl:   No Known Allergies  I personally reviewed active problem list, medication list, allergies,  health maintenance, notes from last encounter, lab results with the patient/caregiver today.   Review of Systems  Eyes:  Negative for blurred vision and double vision.  Respiratory:  Negative for shortness of breath and wheezing.   Cardiovascular:  Negative for chest pain, palpitations and leg swelling.  Gastrointestinal:  Negative for diarrhea, heartburn, nausea and vomiting.  Skin:  Negative for itching and rash.  Neurological:  Negative for dizziness, tingling, tremors, loss of consciousness, weakness and headaches.      Objective  Vitals:   05/23/22 0818  BP: 106/64  Pulse: 74  Temp: (!) 96.2 F (35.7 C)  TempSrc: Temporal  SpO2: 97%  Weight: 163 lb (73.9 kg)    Body mass index is 31.83 kg/m.  Physical Exam Vitals reviewed.  Constitutional:      General: She is awake.     Appearance: Normal appearance. She is well-developed and well-groomed.  HENT:     Head: Normocephalic and atraumatic.  Neck:     Thyroid: No thyroid mass, thyromegaly or thyroid tenderness.  Cardiovascular:     Rate and Rhythm: Normal rate and regular rhythm.     Pulses: Normal pulses.          Radial pulses are 2+ on the right side and 2+ on the left side.     Heart sounds: Normal heart sounds. No murmur heard.    No friction rub. No gallop.  Pulmonary:     Effort: Pulmonary effort is normal.     Breath sounds: Normal breath sounds. No decreased air movement. No decreased breath sounds, wheezing, rhonchi or rales.  Musculoskeletal:     Cervical back: Normal range of motion and neck supple. Normal range of motion.     Right lower leg: No edema.     Left lower leg: No edema.  Neurological:     General: No focal deficit present.     Mental Status: She is alert and oriented to person, place, and time. Mental status is at baseline.     GCS: GCS eye subscore is 4. GCS verbal subscore is 5. GCS motor subscore is 6.     Cranial Nerves: Cranial nerves 2-12 are intact.     Gait: Gait is intact.   Psychiatric:        Attention and Perception: Attention normal.        Mood and Affect: Mood normal.        Speech: Speech normal.        Behavior: Behavior normal. Behavior is cooperative.      No results found for this or any previous visit (from the past 2160 hour(s)).   PHQ2/9:    03/21/2022    8:15 AM 08/03/2021    8:45 AM 06/14/2021    8:06 AM 05/05/2021    8:39 AM 01/14/2021    9:06 AM  Depression screen PHQ 2/9  Decreased Interest 0 0 0 0 0  Down, Depressed, Hopeless 0 0 0 0 0  PHQ - 2 Score 0  0 0 0 0  Altered sleeping  0 0 0 0  Tired, decreased energy  0 0 0 0  Change in appetite  0 0 0 0  Feeling bad or failure about yourself   0 0 0 0  Trouble concentrating  0 0 0 0  Moving slowly or fidgety/restless  0 0 0 0  Suicidal thoughts  0 0 0 0  PHQ-9 Score  0 0 0 0  Difficult doing work/chores  Not difficult at all Not difficult at all Not difficult at all Not difficult at all      Fall Risk:    03/21/2022    8:15 AM 08/03/2021    8:44 AM 06/14/2021    8:06 AM 05/05/2021    8:38 AM 08/04/2020    8:48 AM  Fall Risk   Falls in the past year? 0 1 1 1 $ 0  Number falls in past yr:  0 0 0 0  Injury with Fall? 0 0 0 0 0  Risk for fall due to :  History of fall(s);No Fall Risks No Fall Risks History of fall(s)   Follow up  Falls evaluation completed Falls evaluation completed Falls evaluation completed       Functional Status Survey:      Assessment & Plan  Problem List Items Addressed This Visit       Cardiovascular and Mediastinum   Essential hypertension    Chronic, historic condition  Currently appears well controlled with Lisinopril-HCTZ 10-12.5 mg PO QD  Her cholesterol appears managed with Lovastatin - recheck lipid panel today Continue current medications- refills offered but patient declined saying she has adequate supply Follow up in 6 months or sooner if concerns arise      Relevant Orders   CBC w/Diff/Platelet   COMPLETE METABOLIC PANEL WITH GFR    TSH     Endocrine   Type 2 diabetes mellitus with hyperglycemia, without long-term current use of insulin (HCC)    Chronic, historic condition Previous A1c was 6.6%  She is taking Metformin 750 mg PO BID and Glipizide 5 mg PO BID - appears to be tolerating well, continue current regimen Discussed regular eye exams, diet and exercise for further management of DM Recheck A1c, microalbumin, lipid panel today- results to dictate further management Follow up in 3-6 months pending lab results and further concerns      Relevant Orders   HgB A1c   Microalbumin, urine   Hyperlipidemia associated with type 2 diabetes mellitus ([place]) - Primary    Chronic, historic condition Currently managed with Lovastatin 10 mg PO QHS - she appears to be tolerating this well Recheck lipid panel today  Follow up in 6 months for regular monitoring       Relevant Orders   Lipid Profile     Return in about 3 months (around 08/21/2022) for HTN, Diabetes follow up.   I, Courvoisier Hamblen E Tiffny Gemmer, PA-C, have reviewed all documentation for this visit. The documentation on 05/23/22 for the exam, diagnosis, procedures, and orders are all accurate and complete.   Talitha Givens, MHS, PA-C [facility]

## 2022-05-23 NOTE — Assessment & Plan Note (Signed)
Chronic, historic condition  Currently appears well controlled with Lisinopril-HCTZ 10-12.5 mg PO QD  Her cholesterol appears managed with Lovastatin - recheck lipid panel today Continue current medications- refills offered but patient declined saying she has adequate supply Follow up in 6 months or sooner if concerns arise

## 2022-05-23 NOTE — Assessment & Plan Note (Signed)
Chronic, historic condition Previous A1c was 6.6%  She is taking Metformin 750 mg PO BID and Glipizide 5 mg PO BID - appears to be tolerating well, continue current regimen Discussed regular eye exams, diet and exercise for further management of DM Recheck A1c, microalbumin, lipid panel today- results to dictate further management Follow up in 3-6 months pending lab results and further concerns

## 2022-05-24 ENCOUNTER — Other Ambulatory Visit: Payer: Self-pay | Admitting: Internal Medicine

## 2022-05-24 ENCOUNTER — Ambulatory Visit: Payer: BC Managed Care – PPO | Admitting: Internal Medicine

## 2022-05-24 LAB — TSH: TSH: 3.94 mIU/L (ref 0.40–4.50)

## 2022-05-24 LAB — CBC WITH DIFFERENTIAL/PLATELET
Absolute Monocytes: 435 cells/uL (ref 200–950)
Basophils Absolute: 41 cells/uL (ref 0–200)
Basophils Relative: 0.5 %
Eosinophils Absolute: 262 cells/uL (ref 15–500)
Eosinophils Relative: 3.2 %
HCT: 39.8 % (ref 35.0–45.0)
Hemoglobin: 13.6 g/dL (ref 11.7–15.5)
Lymphs Abs: 2714 cells/uL (ref 850–3900)
MCH: 29 pg (ref 27.0–33.0)
MCHC: 34.2 g/dL (ref 32.0–36.0)
MCV: 84.9 fL (ref 80.0–100.0)
MPV: 10.8 fL (ref 7.5–12.5)
Monocytes Relative: 5.3 %
Neutro Abs: 4748 cells/uL (ref 1500–7800)
Neutrophils Relative %: 57.9 %
Platelets: 247 10*3/uL (ref 140–400)
RBC: 4.69 10*6/uL (ref 3.80–5.10)
RDW: 12.7 % (ref 11.0–15.0)
Total Lymphocyte: 33.1 %
WBC: 8.2 10*3/uL (ref 3.8–10.8)

## 2022-05-24 LAB — COMPLETE METABOLIC PANEL WITH GFR
AG Ratio: 1.3 (calc) (ref 1.0–2.5)
ALT: 23 U/L (ref 6–29)
AST: 21 U/L (ref 10–35)
Albumin: 4.3 g/dL (ref 3.6–5.1)
Alkaline phosphatase (APISO): 63 U/L (ref 37–153)
BUN: 16 mg/dL (ref 7–25)
CO2: 28 mmol/L (ref 20–32)
Calcium: 9.9 mg/dL (ref 8.6–10.4)
Chloride: 105 mmol/L (ref 98–110)
Creat: 0.76 mg/dL (ref 0.50–1.05)
Globulin: 3.4 g/dL (calc) (ref 1.9–3.7)
Glucose, Bld: 122 mg/dL — ABNORMAL HIGH (ref 65–99)
Potassium: 4.1 mmol/L (ref 3.5–5.3)
Sodium: 143 mmol/L (ref 135–146)
Total Bilirubin: 0.4 mg/dL (ref 0.2–1.2)
Total Protein: 7.7 g/dL (ref 6.1–8.1)
eGFR: 89 mL/min/{1.73_m2} (ref >=60)

## 2022-05-24 LAB — LIPID PANEL
Cholesterol: 137 mg/dL (ref <200)
HDL: 41 mg/dL — ABNORMAL LOW (ref >=50)
LDL Cholesterol (Calc): 67 mg/dL (calc)
Non-HDL Cholesterol (Calc): 96 mg/dL (calc) (ref <130)
Total CHOL/HDL Ratio: 3.3 (calc) (ref <5.0)
Triglycerides: 219 mg/dL — ABNORMAL HIGH (ref <150)

## 2022-05-24 LAB — HEMOGLOBIN A1C
Hgb A1c MFr Bld: 6.4 % of total Hgb — ABNORMAL HIGH (ref <5.7)
Mean Plasma Glucose: 137 mg/dL
eAG (mmol/L): 7.6 mmol/L

## 2022-05-24 LAB — MICROALBUMIN, URINE: Microalb, Ur: 0.4 mg/dL

## 2022-05-24 NOTE — Progress Notes (Signed)
Your labs have returned and overall appear stable compared to previous results.  Your triglycerides are elevated - this can happen if you were not fasting for your labs but yours appear to be elevated even in the past- we should continue to monitor this. Electrolytes, liver and kidney function appear stable and in normal ranges Your A1c is 6.4 - this is still in goal range so please continue to take your medications CBC was normal, as was thyroid function.

## 2022-05-24 NOTE — Telephone Encounter (Signed)
Pt called in to follow up on medication refill.  Please advise.

## 2022-05-25 NOTE — Telephone Encounter (Signed)
Requested Prescriptions  Pending Prescriptions Disp Refills   metFORMIN (GLUCOPHAGE-XR) 750 MG 24 hr tablet [Pharmacy Med Name: metFORMIN HCl ER 750 MG Oral Tablet Extended Release 24 Hour] 180 tablet 0    Sig: Take 1 tablet by mouth twice daily     Endocrinology:  Diabetes - Biguanides Failed - 05/24/2022  9:27 AM      Failed - B12 Level in normal range and within 720 days    No results found for: "VITAMINB12"       Passed - Cr in normal range and within 360 days    Creat  Date Value Ref Range Status  05/23/2022 0.76 0.50 - 1.05 mg/dL Final   Creatinine, Urine  Date Value Ref Range Status  01/14/2021 105 20 - 275 mg/dL Final         Passed - HBA1C is between 0 and 7.9 and within 180 days    Hgb A1c MFr Bld  Date Value Ref Range Status  05/23/2022 6.4 (H) <5.7 % of total Hgb Final    Comment:    For someone without known diabetes, a hemoglobin  A1c value between 5.7% and 6.4% is consistent with prediabetes and should be confirmed with a  follow-up test. . For someone with known diabetes, a value <7% indicates that their diabetes is well controlled. A1c targets should be individualized based on duration of diabetes, age, comorbid conditions, and other considerations. . This assay result is consistent with an increased risk of diabetes. . Currently, no consensus exists regarding use of hemoglobin A1c for diagnosis of diabetes for children. .          Passed - eGFR in normal range and within 360 days    GFR, Est African American  Date Value Ref Range Status  04/22/2020 97 > OR = 60 mL/min/1.66m Final   GFR, Est Non African American  Date Value Ref Range Status  04/22/2020 84 > OR = 60 mL/min/1.720mFinal   eGFR  Date Value Ref Range Status  05/23/2022 89 > OR = 60 mL/min/1.7376minal         Passed - Valid encounter within last 6 months    Recent Outpatient Visits           2 days ago Hyperlipidemia associated with type 2 diabetes mellitus ([facility] [facility] months ago Essential hypertension   [facility]   2 months ago Essential hypertension   [facility]   4 months ago Vaginal odor   [facility]   6 months ago Encounter for general adult medical examination with abnormal findings   [facility]       Future Appointments             In 2 months Baity, RegCoralie KeensP [facility] normal limits and completed in the last 12 months    WBC  Date Value Ref Range Status  05/23/2022 8.2 3.8 - 10.8 Thousand/uL Final   RBC  Date Value Ref Range Status  05/23/2022 4.69 3.80 - 5.10 Million/uL Final   Hemoglobin  Date Value Ref Range Status  05/23/2022 13.6 11.7 -  15.5 g/dL Final   HCT  Date Value Ref Range Status  05/23/2022 39.8 35.0 - 45.0 % Final   MCHC  Date Value Ref Range Status  05/23/2022 34.2 32.0 - 36.0 g/dL Final   [facility]  Date Value Ref Range Status  05/23/2022 29.0 27.0 - 33.0 pg Final   MCV  Date Value Ref Range Status  05/23/2022 84.9 80.0 - 100.0 fL Final   No results found for: "PLTCOUNTKUC", "LABPLAT", "POCPLA" RDW  Date Value Ref Range Status  05/23/2022 12.7 11.0 - 15.0 % Final

## 2022-06-16 ENCOUNTER — Other Ambulatory Visit: Payer: Self-pay | Admitting: Internal Medicine

## 2022-06-17 NOTE — Telephone Encounter (Signed)
Requested Prescriptions  Pending Prescriptions Disp Refills   lisinopril-hydrochlorothiazide (ZESTORETIC) 10-12.5 MG tablet [Pharmacy Med Name: Lisinopril-hydroCHLOROthiazide 10-12.5 MG Oral Tablet] 90 tablet 0    Sig: Take 1 tablet by mouth once daily     Cardiovascular:  ACEI + Diuretic Combos Passed - 06/16/2022  5:36 PM      Passed - Na in normal range and within 180 days    Sodium  Date Value Ref Range Status  05/23/2022 143 135 - 146 mmol/L Final  12/07/2018 140 134 - 144 mmol/L Final         Passed - K in normal range and within 180 days    Potassium  Date Value Ref Range Status  05/23/2022 4.1 3.5 - 5.3 mmol/L Final         Passed - Cr in normal range and within 180 days    Creat  Date Value Ref Range Status  05/23/2022 0.76 0.50 - 1.05 mg/dL Final   Creatinine, Urine  Date Value Ref Range Status  01/14/2021 105 20 - 275 mg/dL Final         Passed - eGFR is 30 or above and within 180 days    GFR, Est African American  Date Value Ref Range Status  04/22/2020 97 > OR = 60 mL/min/1.47m2 Final   GFR, Est Non African American  Date Value Ref Range Status  04/22/2020 84 > OR = 60 mL/min/1.65m2 Final   eGFR  Date Value Ref Range Status  05/23/2022 89 > OR = 60 mL/min/1.92m2 Final         Passed - Patient is not pregnant      Passed - Last BP in normal range    BP Readings from Last 1 Encounters:  05/23/22 106/64         Passed - Valid encounter within last 6 months    Recent Outpatient Visits           3 weeks ago Hyperlipidemia associated with type 2 diabetes mellitus ([place])   [facility], Dani Gobble, [place]   2 months ago Essential hypertension   [facility] [place], Coralie Keens, NP   3 months ago Essential hypertension   [facility] [place], Coralie Keens, NP   5 months ago Vaginal odor   [facility] [place], Coralie Keens, NP   7 months ago Encounter for  general adult medical examination with abnormal findings   [facility] [place], Coralie Keens, NP       Future Appointments             In 2 months Baity, Coralie Keens, NP [facility], [place]   In 2 months End, Harrell Gave, MD [place] at [facility]

## 2022-06-29 ENCOUNTER — Other Ambulatory Visit: Payer: Self-pay | Admitting: Internal Medicine

## 2022-06-29 NOTE — Telephone Encounter (Signed)
Medication Refill - Medication: glipiZIDE (GLUCOTROL) 5 MG tablet  ( 1 days left)   Has the patient contacted their pharmacy? Yes.     (Preferred Pharmacy (with phone number or street name):   [place], [place] - [address]  [address] [place] [address]  Phone: [phone] Fax: [phone]  Hours: Not open 24 hours      Has the patient been seen for an appointment in the last year OR does the patient have an upcoming appointment? Yes.    Agent: Please be advised that RX refills may take up to 3 business days. We ask that you follow-up with your pharmacy.

## 2022-06-30 NOTE — Telephone Encounter (Signed)
Requested Prescriptions  Pending Prescriptions Disp Refills   glipiZIDE (GLUCOTROL) 5 MG tablet [Pharmacy Med Name: glipiZIDE 5 MG Oral Tablet] 180 tablet 0    Sig: TAKE 1 TABLET BY MOUTH TWICE DAILY BEFORE A MEAL     Endocrinology:  Diabetes - Sulfonylureas Passed - 06/29/2022 12:06 PM      Passed - HBA1C is between 0 and 7.9 and within 180 days    Hgb A1c MFr Bld  Date Value Ref Range Status  05/23/2022 6.4 (H) <5.7 % of total Hgb Final    Comment:    For someone without known diabetes, a hemoglobin  A1c value between 5.7% and 6.4% is consistent with prediabetes and should be confirmed with a  follow-up test. . For someone with known diabetes, a value <7% indicates that their diabetes is well controlled. A1c targets should be individualized based on duration of diabetes, age, comorbid conditions, and other considerations. . This assay result is consistent with an increased risk of diabetes. . Currently, no consensus exists regarding use of hemoglobin A1c for diagnosis of diabetes for children. .          Passed - Cr in normal range and within 360 days    Creat  Date Value Ref Range Status  05/23/2022 0.76 0.50 - 1.05 mg/dL Final   Creatinine, Urine  Date Value Ref Range Status  01/14/2021 105 20 - 275 mg/dL Final         Passed - Valid encounter within last 6 months    Recent Outpatient Visits           1 month ago Hyperlipidemia associated with type 2 diabetes mellitus ([place])   [facility] Mec[facility], Dani Gobble, [place]   3 months ago Essential hypertension   [facility] [place], Coralie Keens, NP   3 months ago Essential hypertension   [facility] [place], Coralie Keens, NP   5 months ago Vaginal odor   [facility] [place], Coralie Keens, NP   7 months ago Encounter for general adult medical examination with abnormal findings   [facility] [place], Coralie Keens, NP       Future Appointments             In 1 month Baity, Coralie Keens, NP [facility], [place]   In 1 month End, Harrell Gave, MD [place] at [place]

## 2022-08-18 ENCOUNTER — Ambulatory Visit: Payer: BC Managed Care – PPO | Admitting: Internal Medicine

## 2022-08-18 ENCOUNTER — Encounter: Payer: Self-pay | Admitting: Internal Medicine

## 2022-08-18 VITALS — BP 124/62 | HR 78 | Temp 97.1°F | Wt 165.0 lb

## 2022-08-18 DIAGNOSIS — Z6832 Body mass index (BMI) 32.0-32.9, adult: Secondary | ICD-10-CM | POA: Diagnosis not present

## 2022-08-18 DIAGNOSIS — J00 Acute nasopharyngitis [common cold]: Secondary | ICD-10-CM | POA: Diagnosis not present

## 2022-08-18 DIAGNOSIS — I10 Essential (primary) hypertension: Secondary | ICD-10-CM | POA: Diagnosis not present

## 2022-08-18 DIAGNOSIS — E6609 Other obesity due to excess calories: Secondary | ICD-10-CM

## 2022-08-18 NOTE — Progress Notes (Signed)
Subjective:    Patient ID: Courtney Mccarty, female    DOB: [birth year], 61 y.o.   MRN: [id number]  HPI  Patient presents to clinic today requesting to have her blood pressure checked.  She has a history of HTN, managed on Lisinopril HCT.  Her BP today is 124/62.  ECG from 10/2021 reviewed.  She reports she had a cold last week but this has resolved.  She denies residual symptoms.  Review of Systems  Past Medical History:  Diagnosis Date   Essential hypertension 10/11/2016   Hyperlipidemia    Type 2 diabetes mellitus (HCC)     Current Outpatient Medications  Medication Sig Dispense Refill   cholecalciferol (VITAMIN D3) 25 MCG (1000 UNIT) tablet Take 1,000 Units by mouth daily.     glipiZIDE (GLUCOTROL) 5 MG tablet TAKE 1 TABLET BY MOUTH TWICE DAILY BEFORE A MEAL 180 tablet 0   lisinopril-hydrochlorothiazide (ZESTORETIC) 10-12.5 MG tablet Take 1 tablet by mouth once daily 90 tablet 0   lovastatin (MEVACOR) 10 MG tablet TAKE 1 TABLET BY MOUTH AT BEDTIME 90 tablet 3   metFORMIN (GLUCOPHAGE-XR) 750 MG 24 hr tablet Take 1 tablet by mouth twice daily 180 tablet 0   Multiple Vitamin (MULTIVITAMIN) tablet Take 1 tablet by mouth daily.     No current facility-administered medications for this visit.    No Known Allergies  Family History  Problem Relation Age of Onset   Diabetes Mother    Hypertension Mother    Depression Mother    Heart attack Mother 25   Diabetes Father    Hypertension Father    Diabetes Maternal Grandmother    Cancer Maternal Grandfather    Diabetes Paternal Grandmother    Cancer Paternal Grandfather     Social History   Socioeconomic History   Marital status: Single    Spouse name: Not on file   Number of children: Not on file   Years of education: Not on file   Highest education level: Not on file  Occupational History   Not on file  Tobacco Use   Smoking status: Never   Smokeless tobacco: Never  Vaping Use   Vaping Use: Never used  Substance and  Sexual Activity   Alcohol use: Yes    Alcohol/week: 12.0 standard drinks of alcohol    Types: 12 Cans of beer per week    Comment: weekly   Drug use: No   Sexual activity: Not Currently    Birth control/protection: None  Other Topics Concern   Not on file  Social History Narrative   Not on file   Social Determinants of Health   Financial Resource Strain: Not on file  Food Insecurity: Not on file  Transportation Needs: Not on file  Physical Activity: Not on file  Stress: Not on file  Social Connections: Not on file  Intimate Partner Violence: Not on file     Constitutional: Denies fever, malaise, fatigue, headache or abrupt weight changes.  HEENT: Denies eye pain, eye redness, ear pain, ringing in the ears, wax buildup, runny nose, nasal congestion, bloody nose, or sore throat. Respiratory: Denies difficulty breathing, shortness of breath, cough or sputum production.   Cardiovascular: Denies chest pain, chest tightness, palpitations or swelling in the hands or feet.  Gastrointestinal: Denies abdominal pain, bloating, constipation, diarrhea or blood in the stool.  GU: Denies urgency, frequency, pain with urination, burning sensation, blood in urine, odor or discharge. Musculoskeletal: Denies decrease in range of motion, difficulty with gait, muscle  pain or joint pain and swelling.  Skin: Denies redness, rashes, lesions or ulcercations.  Neurological: Denies dizziness, difficulty with memory, difficulty with speech or problems with balance and coordination.  Psych: Denies anxiety, depression, SI/HI.  No other specific complaints in a complete review of systems (except as listed in HPI above).     Objective:   Physical Exam   BP 124/62 (BP Location: Left Arm, Patient Position: Sitting, Cuff Size: Normal)   Pulse 78   Temp (!) 97.1 F (36.2 C) (Temporal)   Wt 165 lb (74.8 kg)   LMP 04/05/2015 (Approximate)   SpO2 96%   BMI 32.22 kg/m   Wt Readings from Last 3  Encounters:  05/23/22 163 lb (73.9 kg)  03/21/22 166 lb (75.3 kg)  03/08/22 163 lb (73.9 kg)    General: Appears her stated age, obese, in NAD. HEENT: Head: normal shape and size; Eyes: sclera white, no icterus, conjunctiva pink, PERRLA and EOMs intact;  Cardiovascular: Normal rate and rhythm. S1,S2 noted.  No murmur, rubs or gallops noted. No JVD or BLE edema.  Pulmonary/Chest: Normal effort and positive vesicular breath sounds. No respiratory distress. No wheezes, rales or ronchi noted.  al bowel sounds. No distention or masses noted. Liver, spleen and kidneys non palpable. Musculoskeletal: Normal range of motion.  No difficulty with gait.  Neurological: Alert and oriented. Coordination normal.      BMET    Component Value Date/Time   NA 143 05/23/2022 0842   NA 140 12/07/2018 1009   K 4.1 05/23/2022 0842   CL 105 05/23/2022 0842   CO2 28 05/23/2022 0842   GLUCOSE 122 (H) 05/23/2022 0842   BUN 16 05/23/2022 0842   BUN 12 12/07/2018 1009   CREATININE 0.76 05/23/2022 0842   CALCIUM 9.9 05/23/2022 0842   GFRNONAA 84 04/22/2020 0832   GFRAA 97 04/22/2020 0832    Lipid Panel     Component Value Date/Time   CHOL 137 05/23/2022 0842   TRIG 219 (H) 05/23/2022 0842   HDL 41 (L) 05/23/2022 0842   CHOLHDL 3.3 05/23/2022 0842   VLDL 58 (H) 11/08/2016 0947   LDLCALC 67 05/23/2022 0842    CBC    Component Value Date/Time   WBC 8.2 05/23/2022 0842   RBC 4.69 05/23/2022 0842   HGB 13.6 05/23/2022 0842   HCT 39.8 05/23/2022 0842   PLT 247 05/23/2022 0842   MCV 84.9 05/23/2022 0842   MCH 29.0 05/23/2022 0842   MCHC 34.2 05/23/2022 0842   RDW 12.7 05/23/2022 0842   LYMPHSABS 2,714 05/23/2022 0842   MONOABS 360 11/08/2016 0947   EOSABS 262 05/23/2022 0842   BASOSABS 41 05/23/2022 0842    Hgb A1C Lab Results  Component Value Date   HGBA1C 6.4 (H) 05/23/2022           Assessment & Plan:   Acute Nasopharyngitis:  Symptoms resolved No indication for  antibiotics, steroids or further workup at this time Work note provided  RTC in 3 months for annual exam Nicki Reaper, NP

## 2022-08-18 NOTE — Assessment & Plan Note (Signed)
Controlled on lisinopril HCT Reinforced at diet and exercise for weight loss

## 2022-08-18 NOTE — Patient Instructions (Signed)

## 2022-08-18 NOTE — Assessment & Plan Note (Signed)
Encourage diet and exercise for weight loss 

## 2022-08-22 ENCOUNTER — Ambulatory Visit: Payer: BC Managed Care – PPO | Admitting: Internal Medicine

## 2022-08-26 ENCOUNTER — Ambulatory Visit: Payer: BC Managed Care – PPO | Admitting: Internal Medicine

## 2022-09-19 ENCOUNTER — Other Ambulatory Visit: Payer: Self-pay | Admitting: Internal Medicine

## 2022-09-20 ENCOUNTER — Other Ambulatory Visit: Payer: Self-pay | Admitting: Internal Medicine

## 2022-09-20 NOTE — Telephone Encounter (Signed)
Unable to refill per protocol, Rx request was refilled 09/20/22 for 30 days, duplicate request.  Requested Prescriptions  Pending Prescriptions Disp Refills   metFORMIN (GLUCOPHAGE-XR) 750 MG 24 hr tablet 180 tablet 0    Sig: Take 1 tablet (750 mg total) by mouth 2 (two) times daily.     Endocrinology:  Diabetes - Biguanides Failed - 09/20/2022  9:43 AM      Failed - B12 Level in normal range and within 720 days    No results found for: "VITAMINB12"       Passed - Cr in normal range and within 360 days    Creat  Date Value Ref Range Status  05/23/2022 0.76 0.50 - 1.05 mg/dL Final   Creatinine, Urine  Date Value Ref Range Status  01/14/2021 105 20 - 275 mg/dL Final         Passed - HBA1C is between 0 and 7.9 and within 180 days    Hgb A1c MFr Bld  Date Value Ref Range Status  05/23/2022 6.4 (H) <5.7 % of total Hgb Final    Comment:    For someone without known diabetes, a hemoglobin  A1c value between 5.7% and 6.4% is consistent with prediabetes and should be confirmed with a  follow-up test. . For someone with known diabetes, a value <7% indicates that their diabetes is well controlled. A1c targets should be individualized based on duration of diabetes, age, comorbid conditions, and other considerations. . This assay result is consistent with an increased risk of diabetes. . Currently, no consensus exists regarding use of hemoglobin A1c for diagnosis of diabetes for children. .          Passed - eGFR in normal range and within 360 days    GFR, Est African American  Date Value Ref Range Status  04/22/2020 97 > OR = 60 mL/min/1.85m2 Final   GFR, Est Non African American  Date Value Ref Range Status  04/22/2020 84 > OR = 60 mL/min/1.32m2 Final   eGFR  Date Value Ref Range Status  05/23/2022 89 > OR = 60 mL/min/1.65m2 Final         Passed - Valid encounter within last 6 months    Recent Outpatient Visits           1 month ago Essential hypertension   [facility] [place], [place] W, NP   4 months ago Hyperlipidemia associated with type 2 diabetes mellitus [facility])   [place] [facility] [place], Oswaldo [place], [place]   6 months ago Essential hypertension   [place] [facility] [place], Salvadore [place], NP   6 months ago Essential hypertension   [facility] [place], Salvadore Oxford, NP   8 months ago Vaginal odor    [facility] [place], [place], NP       Future Appointments             In 2 months End, Cristal Deer, MD [facility] - CBC within normal limits and completed in the last 12 months    WBC  Date Value Ref Range Status  05/23/2022 8.2 3.8 - 10.8 Thousand/uL Final   RBC  Date Value Ref Range Status  05/23/2022 4.69 3.80 - 5.10 Million/uL Final   Hemoglobin  Date Value Ref Range Status  05/23/2022 13.6 11.7 - 15.5 g/dL Final  HCT  Date Value Ref Range Status  05/23/2022 39.8 35.0 - 45.0 % Final   MCHC  Date Value Ref Range Status  05/23/2022 34.2 32.0 - 36.0 g/dL Final   [facility]  Date Value Ref Range Status  05/23/2022 29.0 27.0 - 33.0 pg Final   MCV  Date Value Ref Range Status  05/23/2022 84.9 80.0 - 100.0 fL Final   No results found for: "PLTCOUNTKUC", "LABPLAT", "POCPLA" RDW  Date Value Ref Range Status  05/23/2022 12.7 11.0 - 15.0 % Final          glipiZIDE (GLUCOTROL) 5 MG tablet 180 tablet 0    Sig: Take 1 tablet (5 mg total) by mouth 2 (two) times daily before a meal.     Endocrinology:  Diabetes - Sulfonylureas Passed - 09/20/2022  9:43 AM      Passed - HBA1C is between 0 and 7.9 and within 180 days    Hgb A1c MFr Bld  Date Value Ref Range Status  05/23/2022 6.4 (H) <5.7 % of total Hgb Final    Comment:    For someone without known diabetes, a hemoglobin  A1c value between 5.7% and 6.4% is consistent with prediabetes and should be confirmed with a  follow-up  test. . For someone with known diabetes, a value <7% indicates that their diabetes is well controlled. A1c targets should be individualized based on duration of diabetes, age, comorbid conditions, and other considerations. . This assay result is consistent with an increased risk of diabetes. . Currently, no consensus exists regarding use of hemoglobin A1c for diagnosis of diabetes for children. .          Passed - Cr in normal range and within 360 days    Creat  Date Value Ref Range Status  05/23/2022 0.76 0.50 - 1.05 mg/dL Final   Creatinine, Urine  Date Value Ref Range Status  01/14/2021 105 20 - 275 mg/dL Final         Passed - Valid encounter within last 6 months    Recent Outpatient Visits           1 month ago Essential hypertension   [place] [facility] [place], [place] W, NP   4 months ago Hyperlipidemia associated with type 2 diabetes mellitus [facility])   [place] [facility] Mecum, Oswaldo [place], PA-C   6 months ago Essential hypertension   [place] [facility], Salvadore [place], NP   6 months ago Essential hypertension   [place] [facility] [place], Salvadore Oxford, NP   8 months ago Vaginal odor    [facility] [place], Salvadore Oxford, NP       Future Appointments             In 2 months End, Cristal Deer, MD [facility]             lisinopril-hydrochlorothiazide (ZESTORETIC) 10-12.5 MG tablet 90 tablet 0    Sig: Take 1 tablet by mouth daily.     Cardiovascular:  ACEI + Diuretic Combos Passed - 09/20/2022  9:43 AM      Passed - Na in normal range and within 180 days    Sodium  Date Value Ref Range Status  05/23/2022 143 135 - 146 mmol/L Final  12/07/2018 140 134 - 144 mmol/L Final         Passed - K in normal range and within 180 days  Potassium  Date Value Ref Range Status  05/23/2022 4.1 3.5 - 5.3 mmol/L Final         Passed - Cr in  normal range and within 180 days    Creat  Date Value Ref Range Status  05/23/2022 0.76 0.50 - 1.05 mg/dL Final   Creatinine, Urine  Date Value Ref Range Status  01/14/2021 105 20 - 275 mg/dL Final         Passed - eGFR is 30 or above and within 180 days    GFR, Est African American  Date Value Ref Range Status  04/22/2020 97 > OR = 60 mL/min/1.68m2 Final   GFR, Est Non African American  Date Value Ref Range Status  04/22/2020 84 > OR = 60 mL/min/1.19m2 Final   eGFR  Date Value Ref Range Status  05/23/2022 89 > OR = 60 mL/min/1.57m2 Final         Passed - Patient is not pregnant      Passed - Last BP in normal range    BP Readings from Last 1 Encounters:  08/18/22 124/62         Passed - Valid encounter within last 6 months    Recent Outpatient Visits           1 month ago Essential hypertension   [place] [facility] [place], [place] W, NP   4 months ago Hyperlipidemia associated with type 2 diabetes mellitus [facility])   [place] [facility] Mecum, Oswaldo [place]roy, [place]   6 months ago Essential hypertension   [place] [facility] [place], Salvadore Oxford, NP   6 months ago Essential hypertension   [place] [facility] [place], Salvadore Oxford, NP   8 months ago Vaginal odor   [place] [facility] [place], Salvadore Oxford, NP       Future Appointments             In 2 months End, Cristal Deer, MD [facility]

## 2022-09-20 NOTE — Telephone Encounter (Signed)
Patient called, left VM to return the call to the office to scheduled an CPE for medication refill request.    Requested Prescriptions  Pending Prescriptions Disp Refills   metFORMIN (GLUCOPHAGE-XR) 750 MG 24 hr tablet [Pharmacy Med Name: metFORMIN HCl ER 750 MG Oral Tablet Extended Release 24 Hour] 180 tablet 0    Sig: Take 1 tablet by mouth twice daily     Endocrinology:  Diabetes - Biguanides Failed - 09/19/2022  3:40 PM      Failed - B12 Level in normal range and within 720 days    No results found for: "VITAMINB12"       Passed - Cr in normal range and within 360 days    Creat  Date Value Ref Range Status  05/23/2022 0.76 0.50 - 1.05 mg/dL Final   Creatinine, Urine  Date Value Ref Range Status  01/14/2021 105 20 - 275 mg/dL Final         Passed - HBA1C is between 0 and 7.9 and within 180 days    Hgb A1c MFr Bld  Date Value Ref Range Status  05/23/2022 6.4 (H) <5.7 % of total Hgb Final    Comment:    For someone without known diabetes, a hemoglobin  A1c value between 5.7% and 6.4% is consistent with prediabetes and should be confirmed with a  follow-up test. . For someone with known diabetes, a value <7% indicates that their diabetes is well controlled. A1c targets should be individualized based on duration of diabetes, age, comorbid conditions, and other considerations. . This assay result is consistent with an increased risk of diabetes. . Currently, no consensus exists regarding use of hemoglobin A1c for diagnosis of diabetes for children. .          Passed - eGFR in normal range and within 360 days    GFR, Est African American  Date Value Ref Range Status  04/22/2020 97 > OR = 60 mL/min/1.71m2 Final   GFR, Est Non African American  Date Value Ref Range Status  04/22/2020 84 > OR = 60 mL/min/1.58m2 Final   eGFR  Date Value Ref Range Status  05/23/2022 89 > OR = 60 mL/min/1.4m2 Final         Passed - Valid encounter within last 6 months    Recent  Outpatient Visits           1 month ago Essential hypertension   [place] [facility] [place], [place] W, NP   4 months ago Hyperlipidemia associated with type 2 diabetes mellitus [facility])   [place] [facility], Oswaldo [place], [place]   6 months ago Essential hypertension   [place] [facility] [place], Salvadore Oxford, NP   6 months ago Essential hypertension   [facility] [place], Salvadore Oxford, NP   8 months ago Vaginal odor   [place] [facility] [place], [place], NP       Future Appointments             In 2 months End, Cristal Deer, MD [facility] - CBC within normal limits and completed in the last 12 months    WBC  Date Value Ref Range Status  05/23/2022 8.2 3.8 - 10.8 Thousand/uL Final   RBC  Date Value Ref Range Status  05/23/2022 4.69 3.80 - 5.10 Million/uL Final  Hemoglobin  Date Value Ref Range Status  05/23/2022 13.6 11.7 - 15.5 g/dL Final   HCT  Date Value Ref Range Status  05/23/2022 39.8 35.0 - 45.0 % Final   MCHC  Date Value Ref Range Status  05/23/2022 34.2 32.0 - 36.0 g/dL Final   [facility]  Date Value Ref Range Status  05/23/2022 29.0 27.0 - 33.0 pg Final   MCV  Date Value Ref Range Status  05/23/2022 84.9 80.0 - 100.0 fL Final   No results found for: "PLTCOUNTKUC", "LABPLAT", "POCPLA" RDW  Date Value Ref Range Status  05/23/2022 12.7 11.0 - 15.0 % Final          lovastatin (MEVACOR) 10 MG tablet [Pharmacy Med Name: Lovastatin 10 MG Oral Tablet] 90 tablet 0    Sig: TAKE 1 TABLET BY MOUTH AT BEDTIME     Cardiovascular:  Antilipid - Statins 2 Failed - 09/19/2022  3:40 PM      Failed - Lipid Panel in normal range within the last 12 months    Cholesterol  Date Value Ref Range Status  05/23/2022 137 <200 mg/dL Final   LDL Cholesterol (Calc)  Date Value Ref Range Status  05/23/2022 67 mg/dL (calc) Final    Comment:     Reference range: <100 . Desirable range <100 mg/dL for primary prevention;   <70 mg/dL for patients with CHD or diabetic patients  with > or = 2 CHD risk factors. Marland Kitchen LDL-C is now calculated using the Martin-Hopkins  calculation, which is a validated novel method providing  better accuracy than the Friedewald equation in the  estimation of LDL-C.  Horald Pollen et al. Lenox Ahr. 6578;469(62): 2061-2068  (http://education.Quest[employer].com/faq/FAQ164)    HDL  Date Value Ref Range Status  05/23/2022 41 (L) > OR = 50 mg/dL Final   Triglycerides  Date Value Ref Range Status  05/23/2022 219 (H) <150 mg/dL Final    Comment:    . If a non-fasting specimen was collected, consider repeat triglyceride testing on a fasting specimen if clinically indicated.  Perry Mount et al. J. of Clin. Lipidol. 2015;9:129-169. Marland Kitchen          Passed - Cr in normal range and within 360 days    Creat  Date Value Ref Range Status  05/23/2022 0.76 0.50 - 1.05 mg/dL Final   Creatinine, Urine  Date Value Ref Range Status  01/14/2021 105 20 - 275 mg/dL Final         Passed - Patient is not pregnant      Passed - Valid encounter within last 12 months    Recent Outpatient Visits           1 month ago Essential hypertension   [place] [facility] [place], [place] W, NP   4 months ago Hyperlipidemia associated with type 2 diabetes mellitus [facility])   [place] [facility] Mecum, Oswaldo [place], [place]   6 months ago Essential hypertension   [place] [facility] [place], Salvadore Oxford, NP   6 months ago Essential hypertension   [place] [facility] [place], Salvadore Oxford, NP   8 months ago Vaginal odor   [place] [facility] [place], Salvadore Oxford, NP       Future Appointments             In 2 months End, Cristal Deer, MD [facility]             glipiZIDE (  GLUCOTROL) 5 MG tablet [Pharmacy Med Name: glipiZIDE 5 MG  Oral Tablet] 180 tablet 0    Sig: TAKE 1 TABLET BY MOUTH TWICE DAILY BEFORE A MEAL     Endocrinology:  Diabetes - Sulfonylureas Passed - 09/19/2022  3:40 PM      Passed - HBA1C is between 0 and 7.9 and within 180 days    Hgb A1c MFr Bld  Date Value Ref Range Status  05/23/2022 6.4 (H) <5.7 % of total Hgb Final    Comment:    For someone without known diabetes, a hemoglobin  A1c value between 5.7% and 6.4% is consistent with prediabetes and should be confirmed with a  follow-up test. . For someone with known diabetes, a value <7% indicates that their diabetes is well controlled. A1c targets should be individualized based on duration of diabetes, age, comorbid conditions, and other considerations. . This assay result is consistent with an increased risk of diabetes. . Currently, no consensus exists regarding use of hemoglobin A1c for diagnosis of diabetes for children. .          Passed - Cr in normal range and within 360 days    Creat  Date Value Ref Range Status  05/23/2022 0.76 0.50 - 1.05 mg/dL Final   Creatinine, Urine  Date Value Ref Range Status  01/14/2021 105 20 - 275 mg/dL Final         Passed - Valid encounter within last 6 months    Recent Outpatient Visits           1 month ago Essential hypertension   [place] [facility] [place], [place] W, NP   4 months ago Hyperlipidemia associated with type 2 diabetes mellitus [facility])   [place] [facility] Mecum, Oswaldo [place], [place]   6 months ago Essential hypertension   [place] [facility] [place], [place], NP   6 months ago Essential hypertension   [place] [facility] [place], [place], NP   8 months ago Vaginal odor   [place] [facility] [place], Salvadore Oxford, NP       Future Appointments             In 2 months End, Cristal Deer, MD [place] [facility]             lisinopril-hydrochlorothiazide  (ZESTORETIC) 10-12.5 MG tablet [Pharmacy Med Name: Lisinopril-hydroCHLOROthiazide 10-12.5 MG Oral Tablet] 90 tablet 0    Sig: Take 1 tablet by mouth once daily     Cardiovascular:  ACEI + Diuretic Combos Passed - 09/19/2022  3:40 PM      Passed - Na in normal range and within 180 days    Sodium  Date Value Ref Range Status  05/23/2022 143 135 - 146 mmol/L Final  12/07/2018 140 134 - 144 mmol/L Final         Passed - K in normal range and within 180 days    Potassium  Date Value Ref Range Status  05/23/2022 4.1 3.5 - 5.3 mmol/L Final         Passed - Cr in normal range and within 180 days    Creat  Date Value Ref Range Status  05/23/2022 0.76 0.50 - 1.05 mg/dL Final   Creatinine, Urine  Date Value Ref Range Status  01/14/2021 105 20 - 275 mg/dL Final         Passed - eGFR is 30 or above and  within 180 days    GFR, Est African American  Date Value Ref Range Status  04/22/2020 97 > OR = 60 mL/min/1.69m2 Final   GFR, Est Non African American  Date Value Ref Range Status  04/22/2020 84 > OR = 60 mL/min/1.3m2 Final   eGFR  Date Value Ref Range Status  05/23/2022 89 > OR = 60 mL/min/1.57m2 Final         Passed - Patient is not pregnant      Passed - Last BP in normal range    BP Readings from Last 1 Encounters:  08/18/22 124/62         Passed - Valid encounter within last 6 months    Recent Outpatient Visits           1 month ago Essential hypertension   [facility] [place], [place] W, NP   4 months ago Hyperlipidemia associated with type 2 diabetes mellitus [facility])   [place] [facility] Mecum, Oswaldo [place]roy, [place]   6 months ago Essential hypertension   [place] [facility] [place], Salvadore Oxford, NP   6 months ago Essential hypertension   [place] [facility] [place], Salvadore Oxford, NP   8 months ago Vaginal odor   [place] [facility] [place], Salvadore Oxford, NP       Future  Appointments             In 2 months End, Cristal Deer, MD [facility]

## 2022-09-20 NOTE — Telephone Encounter (Signed)
Medication Refill - Medication: metFORMIN HCl /Lovastatin 10mg  /glipiZIDE/Lisinopril-hydroCHLOROthizide 750 MG 24   Pt is all out of these meds, says was told now pharmacy has these in stock  Has the patient contacted their pharmacy? yes (Agent: If no, request that the patient contact the pharmacy for the refill. If patient does not wish to contact the pharmacy document the reason why and proceed with request.) (Agent: If yes, when and what did the pharmacy advise?)  Preferred Pharmacy (with phone number or street name):  [facility] [place], [place] - [address] Phone: [phone]  Fax: [phone]     Has the patient been seen for an appointment in the last year OR does the patient have an upcoming appointment? yes  Agent: Please be advised that RX refills may take up to 3 business days. We ask that you follow-up with your pharmacy.

## 2022-10-16 ENCOUNTER — Other Ambulatory Visit: Payer: Self-pay | Admitting: Internal Medicine

## 2022-10-17 NOTE — Telephone Encounter (Signed)
Requested Prescriptions  Pending Prescriptions Disp Refills   metFORMIN (GLUCOPHAGE-XR) 750 MG 24 hr tablet [Pharmacy Med Name: metFORMIN HCl ER 750 MG Oral Tablet Extended Release 24 Hour] 180 tablet 0    Sig: Take 1 tablet by mouth twice daily     Endocrinology:  Diabetes - Biguanides Failed - 10/16/2022 10:22 AM      Failed - B12 Level in normal range and within 720 days    No results found for: "VITAMINB12"       Passed - Cr in normal range and within 360 days    Creat  Date Value Ref Range Status  05/23/2022 0.76 0.50 - 1.05 mg/dL Final   Creatinine, Urine  Date Value Ref Range Status  01/14/2021 105 20 - 275 mg/dL Final         Passed - HBA1C is between 0 and 7.9 and within 180 days    Hgb A1c MFr Bld  Date Value Ref Range Status  05/23/2022 6.4 (H) <5.7 % of total Hgb Final    Comment:    For someone without known diabetes, a hemoglobin  A1c value between 5.7% and 6.4% is consistent with prediabetes and should be confirmed with a  follow-up test. . For someone with known diabetes, a value <7% indicates that their diabetes is well controlled. A1c targets should be individualized based on duration of diabetes, age, comorbid conditions, and other considerations. . This assay result is consistent with an increased risk of diabetes. . Currently, no consensus exists regarding use of hemoglobin A1c for diagnosis of diabetes for children. .          Passed - eGFR in normal range and within 360 days    GFR, Est African American  Date Value Ref Range Status  04/22/2020 97 > OR = 60 mL/min/1.27m2 Final   GFR, Est Non African American  Date Value Ref Range Status  04/22/2020 84 > OR = 60 mL/min/1.57m2 Final   eGFR  Date Value Ref Range Status  05/23/2022 89 > OR = 60 mL/min/1.55m2 Final         Passed - Valid encounter within last 6 months    Recent Outpatient Visits           2 months ago Essential hypertension   [place] [facility]  [place], [place] W, NP   4 months ago Hyperlipidemia associated with type 2 diabetes mellitus [facility])   [place] [facility] [place], Oswaldo Conroy, [place]   7 months ago Essential hypertension   [place] [facility] [place], Salvadore Oxford, NP   7 months ago Essential hypertension   [place] [facility], Salvadore Oxford, NP   9 months ago Vaginal odor   [place] [facility] [place], Salvadore Oxford, NP       Future Appointments             In 1 month Baity, Salvadore Oxford, NP [facility], [facility]   In 1 month End, Cristal Deer, MD [place] [facility] - CBC within normal limits and completed in the last 12 months    WBC  Date Value Ref Range Status  05/23/2022 8.2 3.8 - 10.8 Thousand/uL Final   RBC  Date Value Ref Range Status  05/23/2022 4.69 3.80 - 5.10 Million/uL Final   Hemoglobin  Date Value Ref Range Status  05/23/2022 13.6 11.7 - 15.5 g/dL Final   HCT  Date Value Ref Range Status  05/23/2022 39.8 35.0 - 45.0 % Final   MCHC  Date Value Ref Range Status  05/23/2022 34.2 32.0 - 36.0 g/dL Final   [facility]  Date Value Ref Range Status  05/23/2022 29.0 27.0 - 33.0 pg Final   MCV  Date Value Ref Range Status  05/23/2022 84.9 80.0 - 100.0 fL Final   No results found for: "PLTCOUNTKUC", "LABPLAT", "POCPLA" RDW  Date Value Ref Range Status  05/23/2022 12.7 11.0 - 15.0 % Final          lisinopril-hydrochlorothiazide (ZESTORETIC) 10-12.5 MG tablet [Pharmacy Med Name: Lisinopril-hydroCHLOROthiazide 10-12.5 MG Oral Tablet] 90 tablet 0    Sig: TAKE 1 TABLET BY MOUTH ONCE DAILY . APPOINTMENT REQUIRED FOR FUTURE REFILLS     Cardiovascular:  ACEI + Diuretic Combos Passed - 10/16/2022 10:22 AM      Passed - Na in normal range and within 180 days    Sodium  Date Value Ref Range Status  05/23/2022 143 135 - 146 mmol/L Final  12/07/2018 140 134 - 144 mmol/L Final          Passed - K in normal range and within 180 days    Potassium  Date Value Ref Range Status  05/23/2022 4.1 3.5 - 5.3 mmol/L Final         Passed - Cr in normal range and within 180 days    Creat  Date Value Ref Range Status  05/23/2022 0.76 0.50 - 1.05 mg/dL Final   Creatinine, Urine  Date Value Ref Range Status  01/14/2021 105 20 - 275 mg/dL Final         Passed - eGFR is 30 or above and within 180 days    GFR, Est African American  Date Value Ref Range Status  04/22/2020 97 > OR = 60 mL/min/1.67m2 Final   GFR, Est Non African American  Date Value Ref Range Status  04/22/2020 84 > OR = 60 mL/min/1.79m2 Final   eGFR  Date Value Ref Range Status  05/23/2022 89 > OR = 60 mL/min/1.15m2 Final         Passed - Patient is not pregnant      Passed - Last BP in normal range    BP Readings from Last 1 Encounters:  08/18/22 124/62         Passed - Valid encounter within last 6 months    Recent Outpatient Visits           2 months ago Essential hypertension   [place] [facility] [place], Salvadore Oxford, NP   4 months ago Hyperlipidemia associated with type 2 diabetes mellitus [facility])   [place] [facility], Oswaldo Conroy, [place]   7 months ago Essential hypertension   [place] [facility] [place], Salvadore Oxford, NP   7 months ago Essential hypertension   [place] [facility] [place], Salvadore Oxford, NP   9 months ago Vaginal odor   [place] [facility] [place], Salvadore Oxford, NP       Future Appointments             In 1 month Baity, Salvadore Oxford, NP [place] [facility], [facility]   In 1 month End, Cristal Deer, MD [facility]

## 2022-10-30 ENCOUNTER — Other Ambulatory Visit: Payer: Self-pay | Admitting: Internal Medicine

## 2022-11-01 NOTE — Telephone Encounter (Signed)
Requested medication (s) are due for refill today: yes   Requested medication (s) are on the active medication list: yes   Last refill:  09/20/22 #30 0 refills  Future visit scheduled: yes in 2 weeks   Notes to clinic:  protocol failed . Last labs 05/23/22 do you want a 2nd courtesy refill?     Requested Prescriptions  Pending Prescriptions Disp Refills   lovastatin (MEVACOR) 10 MG tablet [Pharmacy Med Name: Lovastatin 10 MG Oral Tablet] 90 tablet 0    Sig: TAKE 1 TABLET BY MOUTH AT BEDTIME     Cardiovascular:  Antilipid - Statins 2 Failed - 10/30/2022  7:42 AM      Failed - Lipid Panel in normal range within the last 12 months    Cholesterol  Date Value Ref Range Status  05/23/2022 137 <200 mg/dL Final   LDL Cholesterol (Calc)  Date Value Ref Range Status  05/23/2022 67 mg/dL (calc) Final    Comment:    Reference range: <100 . Desirable range <100 mg/dL for primary prevention;   <70 mg/dL for patients with CHD or diabetic patients  with > or = 2 CHD risk factors. Marland Kitchen LDL-C is now calculated using the Martin-Hopkins  calculation, which is a validated novel method providing  better accuracy than the Friedewald equation in the  estimation of LDL-C.  Horald Pollen et al. Lenox Ahr. 5784;696(29): 2061-2068  (http://education.Quest[employer].com/faq/FAQ164)    HDL  Date Value Ref Range Status  05/23/2022 41 (L) > OR = 50 mg/dL Final   Triglycerides  Date Value Ref Range Status  05/23/2022 219 (H) <150 mg/dL Final    Comment:    . If a non-fasting specimen was collected, consider repeat triglyceride testing on a fasting specimen if clinically indicated.  Perry Mount et al. J. of Clin. Lipidol. 2015;9:129-169. Marland Kitchen          Passed - Cr in normal range and within 360 days    Creat  Date Value Ref Range Status  05/23/2022 0.76 0.50 - 1.05 mg/dL Final   Creatinine, Urine  Date Value Ref Range Status  01/14/2021 105 20 - 275 mg/dL Final         Passed - Patient is not pregnant       Passed - Valid encounter within last 12 months    Recent Outpatient Visits           2 months ago Essential hypertension   [place] [facility] [place], [place] W, NP   5 months ago Hyperlipidemia associated with type 2 diabetes mellitus [facility])   [place] [facility] Mecum, Oswaldo Conroy, [place]   7 months ago Essential hypertension   [place] [facility] [place], Salvadore Oxford, NP   7 months ago Essential hypertension   [place] [facility] [place], Salvadore Oxford, NP   9 months ago Vaginal odor   [place] [facility] [place], Salvadore Oxford, NP       Future Appointments             In 2 weeks [place], Salvadore Oxford, NP [place] [facility], [facility]   In 3 weeks End, Cristal Deer, MD [facility] [place] [facility]

## 2022-11-16 ENCOUNTER — Other Ambulatory Visit: Payer: Self-pay | Admitting: Internal Medicine

## 2022-11-16 NOTE — Telephone Encounter (Signed)
Medication Refill - Medication: lovastatin (MEVACOR) 10 MG tablet ,  Pt stated she has two tablets left and is requesting a 3 month refill.  Has the patient contacted their pharmacy? Yes.   No, more refills.   (Agent: If yes, when and what did the pharmacy advise?)  Preferred Pharmacy (with phone number or street name):  [facility] [address], [place] - [address]  [address] [place] [address]  Phone: [phone] Fax: [phone]  Hours: Not open 24 hours   Has the patient been seen for an appointment in the last year OR does the patient have an upcoming appointment? Yes.    Agent: Please be advised that RX refills may take up to 3 business days. We ask that you follow-up with your pharmacy.

## 2022-11-18 MED ORDER — LOVASTATIN 10 MG PO TABS: 10.0000 mg | ORAL_TABLET | Freq: Every day | ORAL | 1 refills | Status: DC

## 2022-11-18 NOTE — Telephone Encounter (Signed)
Requested Prescriptions  Pending Prescriptions Disp Refills   lovastatin (MEVACOR) 10 MG tablet 90 tablet 1    Sig: Take 1 tablet (10 mg total) by mouth at bedtime.     Cardiovascular:  Antilipid - Statins 2 Failed - 11/16/2022  2:37 PM      Failed - Lipid Panel in normal range within the last 12 months    Cholesterol  Date Value Ref Range Status  05/23/2022 137 <200 mg/dL Final   LDL Cholesterol (Calc)  Date Value Ref Range Status  05/23/2022 67 mg/dL (calc) Final    Comment:    Reference range: <100 . Desirable range <100 mg/dL for primary prevention;   <70 mg/dL for patients with CHD or diabetic patients  with > or = 2 CHD risk factors. Marland Kitchen LDL-C is now calculated using the Martin-Hopkins  calculation, which is a validated novel method providing  better accuracy than the Friedewald equation in the  estimation of LDL-C.  Horald Pollen et al. Lenox Ahr. 1610;960(45): 2061-2068  (http://education.[facility].com/faq/FAQ164)    HDL  Date Value Ref Range Status  05/23/2022 41 (L) > OR = 50 mg/dL Final   Triglycerides  Date Value Ref Range Status  05/23/2022 219 (H) <150 mg/dL Final    Comment:    . If a non-fasting specimen was collected, consider repeat triglyceride testing on a fasting specimen if clinically indicated.  Perry Mount et al. J. of Clin. Lipidol. 2015;9:129-169. Marland Kitchen          Passed - Cr in normal range and within 360 days    Creat  Date Value Ref Range Status  05/23/2022 0.76 0.50 - 1.05 mg/dL Final   Creatinine, Urine  Date Value Ref Range Status  01/14/2021 105 20 - 275 mg/dL Final         Passed - Patient is not pregnant      Passed - Valid encounter within last 12 months    Recent Outpatient Visits           3 months ago Essential hypertension   [place] [facility] [place], [place] W, NP   5 months ago Hyperlipidemia associated with type 2 diabetes mellitus [facility])   [place] [facility], Oswaldo Conroy, [place]   8  months ago Essential hypertension   [place] [facility] [place], Salvadore Oxford, NP   8 months ago Essential hypertension   [place] [facility] [place], Salvadore Oxford, NP   10 months ago Vaginal odor   [place] [facility] [place], Salvadore Oxford, NP       Future Appointments             In 3 days [place], Salvadore Oxford, NP [facility], [facility]   In 1 week End, Cristal Deer, MD [facility]

## 2022-11-21 ENCOUNTER — Ambulatory Visit (INDEPENDENT_AMBULATORY_CARE_PROVIDER_SITE_OTHER): Payer: BC Managed Care – PPO | Admitting: Internal Medicine

## 2022-11-21 ENCOUNTER — Encounter: Payer: Self-pay | Admitting: Internal Medicine

## 2022-11-21 VITALS — BP 116/64 | HR 71 | Temp 95.3°F | Ht 60.0 in | Wt 159.0 lb

## 2022-11-21 DIAGNOSIS — Z0001 Encounter for general adult medical examination with abnormal findings: Secondary | ICD-10-CM | POA: Diagnosis not present

## 2022-11-21 DIAGNOSIS — Z1211 Encounter for screening for malignant neoplasm of colon: Secondary | ICD-10-CM

## 2022-11-21 DIAGNOSIS — E1165 Type 2 diabetes mellitus with hyperglycemia: Secondary | ICD-10-CM | POA: Diagnosis not present

## 2022-11-21 DIAGNOSIS — Z6831 Body mass index (BMI) 31.0-31.9, adult: Secondary | ICD-10-CM

## 2022-11-21 DIAGNOSIS — E6609 Other obesity due to excess calories: Secondary | ICD-10-CM

## 2022-11-21 MED ORDER — LISINOPRIL-HYDROCHLOROTHIAZIDE 10-12.5 MG PO TABS: 1.0000 | ORAL_TABLET | Freq: Every day | ORAL | 1 refills | Status: DC

## 2022-11-21 MED ORDER — LOVASTATIN 10 MG PO TABS: 10.0000 mg | ORAL_TABLET | Freq: Every day | ORAL | 1 refills | Status: DC

## 2022-11-21 MED ORDER — METFORMIN HCL ER 750 MG PO TB24: 750.0000 mg | ORAL_TABLET | Freq: Two times a day (BID) | ORAL | 1 refills | Status: DC

## 2022-11-21 MED ORDER — GLIPIZIDE 5 MG PO TABS: 5.0000 mg | ORAL_TABLET | Freq: Two times a day (BID) | ORAL | 1 refills | Status: DC

## 2022-11-21 NOTE — Assessment & Plan Note (Signed)
Encourage diet and exercise for weight loss 

## 2022-11-21 NOTE — Progress Notes (Signed)
Subjective:    Patient ID: Courtney Mccarty, female    DOB: December 15, 1960, 62 y.o.   MRN: [id number]  HPI  Patient presents to clinic today for her annual exam.  Flu: Never Tetanus: 11/2016 COVID: Never Shingrix: Never Pap smear: 06/2021 Mammogram: 09/2021 Bone density: 07/2021 Colon screening: 11/2016 Vision screening: annually Dentist: as needed  Diet: She does eat meat. She consumes fruits and veggies. She does eat fried foods. She drinks mostly water, juice Exercise: None  Review of Systems     Past Medical History:  Diagnosis Date   Essential hypertension 10/11/2016   Hyperlipidemia    Type 2 diabetes mellitus (HCC)     Current Outpatient Medications  Medication Sig Dispense Refill   cholecalciferol (VITAMIN D3) 25 MCG (1000 UNIT) tablet Take 1,000 Units by mouth daily.     glipiZIDE (GLUCOTROL) 5 MG tablet TAKE 1 TABLET BY MOUTH TWICE DAILY BEFORE A MEAL 60 tablet 0   lisinopril-hydrochlorothiazide (ZESTORETIC) 10-12.5 MG tablet TAKE 1 TABLET BY MOUTH ONCE DAILY . APPOINTMENT REQUIRED FOR FUTURE REFILLS 90 tablet 0   lovastatin (MEVACOR) 10 MG tablet Take 1 tablet (10 mg total) by mouth at bedtime. 90 tablet 1   metFORMIN (GLUCOPHAGE-XR) 750 MG 24 hr tablet Take 1 tablet by mouth twice daily 180 tablet 0   Multiple Vitamin (MULTIVITAMIN) tablet Take 1 tablet by mouth daily.     No current facility-administered medications for this visit.    No Known Allergies  Family History  Problem Relation Age of Onset   Diabetes Mother    Hypertension Mother    Depression Mother    Heart attack Mother 17   Diabetes Father    Hypertension Father    Diabetes Maternal Grandmother    Cancer Maternal Grandfather    Diabetes Paternal Grandmother    Cancer Paternal Grandfather     Social History   Socioeconomic History   Marital status: Single    Spouse name: Not on file   Number of children: Not on file   Years of education: Not on file   Highest education level: Not on  file  Occupational History   Not on file  Tobacco Use   Smoking status: Never   Smokeless tobacco: Never  Vaping Use   Vaping status: Never Used  Substance and Sexual Activity   Alcohol use: Yes    Alcohol/week: 12.0 standard drinks of alcohol    Types: 12 Cans of beer per week    Comment: weekly   Drug use: No   Sexual activity: Not Currently    Birth control/protection: None  Other Topics Concern   Not on file  Social History Narrative   Not on file   Social Determinants of Health   Financial Resource Strain: Not on file  Food Insecurity: Not on file  Transportation Needs: Not on file  Physical Activity: Not on file  Stress: Not on file  Social Connections: Not on file  Intimate Partner Violence: Not on file     Constitutional: Denies fever, malaise, fatigue, headache or abrupt weight changes.  HEENT: Denies eye pain, eye redness, ear pain, ringing in the ears, wax buildup, runny nose, nasal congestion, bloody nose, or sore throat. Respiratory: Denies difficulty breathing, shortness of breath, cough or sputum production.   Cardiovascular: Denies chest pain, chest tightness, palpitations or swelling in the hands or feet.  Gastrointestinal: Denies abdominal pain, bloating, constipation, diarrhea or blood in the stool.  GU: Denies urgency, frequency, pain with urination, burning  sensation, blood in urine, odor or discharge. Musculoskeletal: Denies decrease in range of motion, difficulty with gait, muscle pain or joint pain and swelling.  Skin: Denies redness, rashes, lesions or ulcercations.  Neurological: Denies dizziness, difficulty with memory, difficulty with speech or problems with balance and coordination.  Psych: Denies anxiety, depression, SI/HI.  No other specific complaints in a complete review of systems (except as listed in HPI above).  Objective:   Physical Exam   BP 116/64 (BP Location: Left Arm, Patient Position: Sitting, Cuff Size: Normal)   Pulse 71    Temp (!) 95.3 F (35.2 C) (Temporal)   Ht 5' (1.524 m)   Wt 159 lb (72.1 kg)   LMP 04/05/2015 (Approximate)   SpO2 96%   BMI 31.05 kg/m   Wt Readings from Last 3 Encounters:  08/18/22 165 lb (74.8 kg)  05/23/22 163 lb (73.9 kg)  03/21/22 166 lb (75.3 kg)    General: Appears her stated age, obese, in NAD. Skin: Warm, dry and intact. No  ulcerations noted. HEENT: Head: normal shape and size; Eyes: sclera white, no icterus, conjunctiva pink, PERRLA and EOMs intact;  Neck:  Neck supple, trachea midline. No masses, lumps or thyromegaly present.  Cardiovascular: Normal rate and rhythm. S1,S2 noted.  No murmur, rubs or gallops noted. No JVD or BLE edema. No carotid bruits noted. Pulmonary/Chest: Normal effort and positive vesicular breath sounds. No respiratory distress. No wheezes, rales or ronchi noted.  Abdomen:  Normal bowel sounds.  Musculoskeletal: Strength 5/5 BUE/BLE.  No difficulty with gait.  Neurological: Alert and oriented. Cranial nerves II-XII grossly intact. Coordination normal.  Psychiatric: Mood and affect flat. Behavior is normal. Judgment and thought content normal.    BMET    Component Value Date/Time   NA 143 05/23/2022 0842   NA 140 12/07/2018 1009   K 4.1 05/23/2022 0842   CL 105 05/23/2022 0842   CO2 28 05/23/2022 0842   GLUCOSE 122 (H) 05/23/2022 0842   BUN 16 05/23/2022 0842   BUN 12 12/07/2018 1009   CREATININE 0.76 05/23/2022 0842   CALCIUM 9.9 05/23/2022 0842   GFRNONAA 84 04/22/2020 0832   GFRAA 97 04/22/2020 0832    Lipid Panel     Component Value Date/Time   CHOL 137 05/23/2022 0842   TRIG 219 (H) 05/23/2022 0842   HDL 41 (L) 05/23/2022 0842   CHOLHDL 3.3 05/23/2022 0842   VLDL 58 (H) 11/08/2016 0947   LDLCALC 67 05/23/2022 0842    CBC    Component Value Date/Time   WBC 8.2 05/23/2022 0842   RBC 4.69 05/23/2022 0842   HGB 13.6 05/23/2022 0842   HCT 39.8 05/23/2022 0842   PLT 247 05/23/2022 0842   MCV 84.9 05/23/2022 0842   MCH  29.0 05/23/2022 0842   MCHC 34.2 05/23/2022 0842   RDW 12.7 05/23/2022 0842   LYMPHSABS 2,714 05/23/2022 0842   MONOABS 360 11/08/2016 0947   EOSABS 262 05/23/2022 0842   BASOSABS 41 05/23/2022 0842    Hgb A1C Lab Results  Component Value Date   HGBA1C 6.4 (H) 05/23/2022           Assessment & Plan:   Preventative health maintenance:  Encouraged her to get a flu shot in fall Tetanus UTD Encouraged her to get her COVID-vaccine Discussed Shingrix vaccine, she will check coverage with her insurance company and schedule visit if she would like to have this done Pap smear UTD She declines further mammograms at this time Bone  density UTD Referral to GI for screening colonoscopy Encouraged her to consume a balanced diet and exercise regimen Advised her to see an eye doctor and dentist annually We will check CBC, c-Met, lipid, A1c today  RTC in 6 months, follow-up chronic conditions Nicki Reaper, NP

## 2022-11-21 NOTE — Patient Instructions (Signed)
Health Maintenance for Postmenopausal Women Menopause is a normal process in which your ability to get pregnant comes to an end. This process happens slowly over many months or years, usually between the ages of 48 and 55. Menopause is complete when you have missed your menstrual period for 12 months. It is important to talk with your health care provider about some of the most common conditions that affect women after menopause (postmenopausal women). These include heart disease, cancer, and bone loss (osteoporosis). Adopting a healthy lifestyle and getting preventive care can help to promote your health and wellness. The actions you take can also lower your chances of developing some of these common conditions. What are the signs and symptoms of menopause? During menopause, you may have the following symptoms: Hot flashes. These can be moderate or severe. Night sweats. Decrease in sex drive. Mood swings. Headaches. Tiredness (fatigue). Irritability. Memory problems. Problems falling asleep or staying asleep. Talk with your health care provider about treatment options for your symptoms. Do I need hormone replacement therapy? Hormone replacement therapy is effective in treating symptoms that are caused by menopause, such as hot flashes and night sweats. Hormone replacement carries certain risks, especially as you become older. If you are thinking about using estrogen or estrogen with progestin, discuss the benefits and risks with your health care provider. How can I reduce my risk for heart disease and stroke? The risk of heart disease, heart attack, and stroke increases as you age. One of the causes may be a change in the body's hormones during menopause. This can affect how your body uses dietary fats, triglycerides, and cholesterol. Heart attack and stroke are medical emergencies. There are many things that you can do to help prevent heart disease and stroke. Watch your blood pressure High  blood pressure causes heart disease and increases the risk of stroke. This is more likely to develop in people who have high blood pressure readings or are overweight. Have your blood pressure checked: Every 3-5 years if you are 18-39 years of age. Every year if you are 40 years old or older. Eat a healthy diet  Eat a diet that includes plenty of vegetables, fruits, low-fat dairy products, and lean protein. Do not eat a lot of foods that are high in solid fats, added sugars, or sodium. Get regular exercise Get regular exercise. This is one of the most important things you can do for your health. Most adults should: Try to exercise for at least 150 minutes each week. The exercise should increase your heart rate and make you sweat (moderate-intensity exercise). Try to do strengthening exercises at least twice each week. Do these in addition to the moderate-intensity exercise. Spend less time sitting. Even light physical activity can be beneficial. Other tips Work with your health care provider to achieve or maintain a healthy weight. Do not use any products that contain nicotine or tobacco. These products include cigarettes, chewing tobacco, and vaping devices, such as e-cigarettes. If you need help quitting, ask your health care provider. Know your numbers. Ask your health care provider to check your cholesterol and your blood sugar (glucose). Continue to have your blood tested as directed by your health care provider. Do I need screening for cancer? Depending on your health history and family history, you may need to have cancer screenings at different stages of your life. This may include screening for: Breast cancer. Cervical cancer. Lung cancer. Colorectal cancer. What is my risk for osteoporosis? After menopause, you may be   at increased risk for osteoporosis. Osteoporosis is a condition in which bone destruction happens more quickly than new bone creation. To help prevent osteoporosis or  the bone fractures that can happen because of osteoporosis, you may take the following actions: If you are 19-50 years old, get at least 1,000 mg of calcium and at least 600 international units (IU) of vitamin D per day. If you are older than age 50 but younger than age 70, get at least 1,200 mg of calcium and at least 600 international units (IU) of vitamin D per day. If you are older than age 70, get at least 1,200 mg of calcium and at least 800 international units (IU) of vitamin D per day. Smoking and drinking excessive alcohol increase the risk of osteoporosis. Eat foods that are rich in calcium and vitamin D, and do weight-bearing exercises several times each week as directed by your health care provider. How does menopause affect my mental health? Depression may occur at any age, but it is more common as you become older. Common symptoms of depression include: Feeling depressed. Changes in sleep patterns. Changes in appetite or eating patterns. Feeling an overall lack of motivation or enjoyment of activities that you previously enjoyed. Frequent crying spells. Talk with your health care provider if you think that you are experiencing any of these symptoms. General instructions See your health care provider for regular wellness exams and vaccines. This may include: Scheduling regular health, dental, and eye exams. Getting and maintaining your vaccines. These include: Influenza vaccine. Get this vaccine each year before the flu season begins. Pneumonia vaccine. Shingles vaccine. Tetanus, diphtheria, and pertussis (Tdap) booster vaccine. Your health care provider may also recommend other immunizations. Tell your health care provider if you have ever been abused or do not feel safe at home. Summary Menopause is a normal process in which your ability to get pregnant comes to an end. This condition causes hot flashes, night sweats, decreased interest in sex, mood swings, headaches, or lack  of sleep. Treatment for this condition may include hormone replacement therapy. Take actions to keep yourself healthy, including exercising regularly, eating a healthy diet, watching your weight, and checking your blood pressure and blood sugar levels. Get screened for cancer and depression. Make sure that you are up to date with all your vaccines. This information is not intended to replace advice given to you by your health care provider. Make sure you discuss any questions you have with your health care provider. Document Revised: 08/10/2020 Document Reviewed: 08/10/2020 Elsevier Patient Education  2024 Elsevier Inc.  

## 2022-11-22 LAB — CBC
HCT: 39 % (ref 35.0–45.0)
Hemoglobin: 13 g/dL (ref 11.7–15.5)
MCH: 29.2 pg (ref 27.0–33.0)
MCHC: 33.3 g/dL (ref 32.0–36.0)
MCV: 87.6 fL (ref 80.0–100.0)
MPV: 10.8 fL (ref 7.5–12.5)
Platelets: 227 10*3/uL (ref 140–400)
RBC: 4.45 10*6/uL (ref 3.80–5.10)
RDW: 12.5 % (ref 11.0–15.0)
WBC: 8.1 10*3/uL (ref 3.8–10.8)

## 2022-11-22 LAB — LIPID PANEL
Cholesterol: 116 mg/dL (ref <200)
HDL: 34 mg/dL — ABNORMAL LOW (ref >=50)
LDL Cholesterol (Calc): 44 mg/dL
Non-HDL Cholesterol (Calc): 82 mg/dL (ref <130)
Total CHOL/HDL Ratio: 3.4 (calc) (ref <5.0)
Triglycerides: 362 mg/dL — ABNORMAL HIGH (ref <150)

## 2022-11-22 LAB — COMPLETE METABOLIC PANEL WITH GFR
AG Ratio: 1.6 (calc) (ref 1.0–2.5)
ALT: 25 U/L (ref 6–29)
AST: 24 U/L (ref 10–35)
Albumin: 4.5 g/dL (ref 3.6–5.1)
Alkaline phosphatase (APISO): 83 U/L (ref 37–153)
BUN: 14 mg/dL (ref 7–25)
CO2: 27 mmol/L (ref 20–32)
Calcium: 10 mg/dL (ref 8.6–10.4)
Chloride: 104 mmol/L (ref 98–110)
Creat: 0.68 mg/dL (ref 0.50–1.05)
Globulin: 2.8 g/dL (ref 1.9–3.7)
Glucose, Bld: 143 mg/dL — ABNORMAL HIGH (ref 65–99)
Potassium: 3.8 mmol/L (ref 3.5–5.3)
Sodium: 140 mmol/L (ref 135–146)
Total Bilirubin: 0.2 mg/dL (ref 0.2–1.2)
Total Protein: 7.3 g/dL (ref 6.1–8.1)
eGFR: 99 mL/min/{1.73_m2} (ref >=60)

## 2022-11-22 LAB — HEMOGLOBIN A1C
Hgb A1c MFr Bld: 6.2 %{Hb} — ABNORMAL HIGH (ref <5.7)
Mean Plasma Glucose: 131 mg/dL
eAG (mmol/L): 7.3 mmol/L

## 2022-11-25 ENCOUNTER — Ambulatory Visit: Payer: BC Managed Care – PPO | Attending: Internal Medicine | Admitting: Internal Medicine

## 2022-11-25 NOTE — Progress Notes (Deleted)
Cardiology Office Note:  .   Date:  11/25/2022  ID:  Javier Sarkisyan, DOB Jan 24, 1961, MRN [id number] PCP: Lorre Munroe, NP  [facility] Providers Cardiologist:  Yvonne Kendall, MD { Click to update primary MD,subspecialty MD or APP then REFRESH:1}    History of Present Illness: .   Courtney Mccarty is a 62 y.o. female with history of hypertension, hyperlipidemia, type 2 diabetes mellitus, and obesity, who presents for follow-up of chest pain.  She was last seen in 10/2021 for follow-up of chest pain that led to ED visit.  Coronary CTA previously showed normal coronaries.  She was feeling well at her follow-up visit; it was suspected that mild troponin elevation at the time of her ED visit was due to supply-demand mismatch.  ROS: See HPI  Studies Reviewed: Marland Kitchen        Coronary CTA (07/16/2020): Normal coronary arteries without plaque or calcification.  No significant extracardiac findings.  Event monitor (06/21/2020): Predominantly sinus rhythm with rare PACs and PVCs.  No significant arrhythmia.  Risk Assessment/Calculations:   {Does this patient have ATRIAL FIBRILLATION?:[phone]} No BP recorded.  {Refresh Note OR Click here to enter BP  :1}***       Physical Exam:   VS:  LMP 04/05/2015 (Approximate)    Wt Readings from Last 3 Encounters:  11/21/22 159 lb (72.1 kg)  08/18/22 165 lb (74.8 kg)  05/23/22 163 lb (73.9 kg)    General:  NAD. Neck: No JVD or HJR. Lungs: Clear to auscultation bilaterally without wheezes or crackles. Heart: Regular rate and rhythm without murmurs, rubs, or gallops. Abdomen: Soft, nontender, nondistended. Extremities: No lower extremity edema.  ASSESSMENT AND PLAN: .    ***    {Are you ordering a CV Procedure (e.g. stress test, cath, DCCV, TEE, etc)?   Press F2        :[id number]}  Dispo: ***  Signed, Yvonne Kendall, MD

## 2022-11-28 ENCOUNTER — Encounter: Payer: Self-pay | Admitting: Internal Medicine

## 2022-11-28 ENCOUNTER — Other Ambulatory Visit: Payer: Self-pay | Admitting: *Deleted

## 2022-11-28 ENCOUNTER — Telehealth: Payer: Self-pay | Admitting: *Deleted

## 2022-11-28 DIAGNOSIS — Z86010 Personal history of colonic polyps: Secondary | ICD-10-CM

## 2022-11-28 MED ORDER — NA SULFATE-K SULFATE-MG SULF 17.5-3.13-1.6 GM/177ML PO SOLN: 1.0000 | Freq: Once | ORAL | 0 refills | Status: AC

## 2022-11-28 NOTE — Telephone Encounter (Signed)
Gastroenterology Pre-Procedure Review  Request Date: 01/09/2023 Requesting Physician: Dr. Servando Snare  PATIENT REVIEW QUESTIONS: The patient responded to the following health history questions as indicated:    1. Are you having any GI issues? no 2. Do you have a personal history of Polyps? yes (11/28/2016) 3. Do you have a family history of Colon Cancer or Polyps? no 4. Diabetes Mellitus? yes (glipizide and metformin) 5. Joint replacements in the past 12 months?no 6. Major health problems in the past 3 months?no 7. Any artificial heart valves, MVP, or defibrillator?no    MEDICATIONS & ALLERGIES:    Patient reports the following regarding taking any anticoagulation/antiplatelet therapy:   Plavix, Coumadin, Eliquis, Xarelto, Lovenox, Pradaxa, Brilinta, or Effient? no Aspirin? no  Patient confirms/reports the following medications:  Current Outpatient Medications  Medication Sig Dispense Refill   cholecalciferol (VITAMIN D3) 25 MCG (1000 UNIT) tablet Take 1,000 Units by mouth daily.     glipiZIDE (GLUCOTROL) 5 MG tablet Take 1 tablet (5 mg total) by mouth 2 (two) times daily before a meal. 180 tablet 1   lisinopril-hydrochlorothiazide (ZESTORETIC) 10-12.5 MG tablet Take 1 tablet by mouth daily. 90 tablet 1   lovastatin (MEVACOR) 10 MG tablet Take 1 tablet (10 mg total) by mouth at bedtime. 90 tablet 1   metFORMIN (GLUCOPHAGE-XR) 750 MG 24 hr tablet Take 1 tablet (750 mg total) by mouth 2 (two) times daily. 180 tablet 1   Multiple Vitamin (MULTIVITAMIN) tablet Take 1 tablet by mouth daily.     No current facility-administered medications for this visit.    Patient confirms/reports the following allergies:  No Known Allergies  No orders of the defined types were placed in this encounter.   AUTHORIZATION INFORMATION Primary Insurance: 1D#: Group #:  Secondary Insurance: 1D#: Group #:  SCHEDULE INFORMATION: Date: 01/09/2023 Time: Location:  [facility]

## 2023-01-03 ENCOUNTER — Encounter: Payer: Self-pay | Admitting: Gastroenterology

## 2023-01-03 NOTE — Anesthesia Preprocedure Evaluation (Addendum)
Anesthesia Evaluation  Patient identified by MRN, date of birth, ID band Patient awake    Reviewed: Allergy & Precautions, H&P , NPO status , Patient's Chart, lab work & pertinent test results  Airway Mallampati: IV  TM Distance: >3 FB Neck ROM: Full    Dental no notable dental hx.    Pulmonary neg pulmonary ROS   Pulmonary exam normal breath sounds clear to auscultation       Cardiovascular hypertension, Normal cardiovascular exam Rhythm:Regular Rate:Normal  Cardiology note 10-27-21  Seen in 06/2020 as a new patient for chest pain and palpitations. Heart monitor and cardiac CTA were ordered.    Cardiac CTA showed calcium score of 0, no CAD. Heart monitor showed NSR with rare PACs and PVCs.    Neuro/Psych negative neurological ROS  negative psych ROS   GI/Hepatic negative GI ROS, Neg liver ROS,,,  Endo/Other  diabetes    Renal/GU negative Renal ROS  negative genitourinary   Musculoskeletal negative musculoskeletal ROS (+)    Abdominal   Peds negative pediatric ROS (+)  Hematology negative hematology ROS (+)   Anesthesia Other Findings   Medical History  Essential hypertension Hyperlipidemia Type 2 diabetes mellitus (HCC)     Reproductive/Obstetrics negative OB ROS                             Anesthesia Physical Anesthesia Plan  ASA: 2  Anesthesia Plan: General   Post-op Pain Management:    Induction: Intravenous  PONV Risk Score and Plan:   Airway Management Planned: Natural Airway and Nasal Cannula  Additional Equipment:   Intra-op Plan:   Post-operative Plan:   Informed Consent: I have reviewed the patients History and Physical, chart, labs and discussed the procedure including the risks, benefits and alternatives for the proposed anesthesia with the patient or authorized representative who has indicated his/her understanding and acceptance.     Dental Advisory  Given  Plan Discussed with: Anesthesiologist, CRNA and Surgeon  Anesthesia Plan Comments: (Patient consented for risks of anesthesia including but not limited to:  - adverse reactions to medications - risk of airway placement if required - damage to eyes, teeth, lips or other oral mucosa - nerve damage due to positioning  - sore throat or hoarseness - Damage to heart, brain, nerves, lungs, other parts of body or loss of life  Patient voiced understanding.)       Anesthesia Quick Evaluation

## 2023-01-09 ENCOUNTER — Ambulatory Visit: Payer: BC Managed Care – PPO | Admitting: Anesthesiology

## 2023-01-09 ENCOUNTER — Other Ambulatory Visit: Payer: Self-pay

## 2023-01-09 ENCOUNTER — Encounter: Payer: Self-pay | Admitting: Gastroenterology

## 2023-01-09 ENCOUNTER — Encounter
Admission: RE | Disposition: A | Discharge status: Home / Self Care | Payer: Self-pay | Source: Ambulatory Visit | Attending: Gastroenterology

## 2023-01-09 ENCOUNTER — Ambulatory Visit
Admission: RE | Admit: 2023-01-09 | Discharge: 2023-01-09 | Disposition: A | Discharge status: Home / Self Care | Payer: BC Managed Care – PPO | Source: Ambulatory Visit | Attending: Gastroenterology | Admitting: Gastroenterology

## 2023-01-09 DIAGNOSIS — Z1211 Encounter for screening for malignant neoplasm of colon: Secondary | ICD-10-CM | POA: Diagnosis not present

## 2023-01-09 DIAGNOSIS — E785 Hyperlipidemia, unspecified: Secondary | ICD-10-CM | POA: Insufficient documentation

## 2023-01-09 DIAGNOSIS — I10 Essential (primary) hypertension: Secondary | ICD-10-CM | POA: Diagnosis not present

## 2023-01-09 DIAGNOSIS — E119 Type 2 diabetes mellitus without complications: Secondary | ICD-10-CM | POA: Insufficient documentation

## 2023-01-09 DIAGNOSIS — K640 First degree hemorrhoids: Secondary | ICD-10-CM | POA: Diagnosis not present

## 2023-01-09 DIAGNOSIS — Z860100 Personal history of colon polyps, unspecified: Secondary | ICD-10-CM

## 2023-01-09 DIAGNOSIS — Z09 Encounter for follow-up examination after completed treatment for conditions other than malignant neoplasm: Secondary | ICD-10-CM | POA: Insufficient documentation

## 2023-01-09 DIAGNOSIS — Z7984 Long term (current) use of oral hypoglycemic drugs: Secondary | ICD-10-CM | POA: Diagnosis not present

## 2023-01-09 DIAGNOSIS — Z860101 Personal history of adenomatous and serrated colon polyps: Secondary | ICD-10-CM | POA: Diagnosis not present

## 2023-01-09 HISTORY — PX: COLONOSCOPY WITH PROPOFOL: SHX5780

## 2023-01-09 SURGERY — COLONOSCOPY WITH PROPOFOL
Anesthesia: General | Site: Rectum

## 2023-01-09 MED ORDER — STERILE WATER FOR IRRIGATION IR SOLN
Status: DC | PRN
  Administered 2023-01-09: 100 mL

## 2023-01-09 MED ORDER — SODIUM CHLORIDE 0.9 % IV SOLN: INTRAVENOUS | Status: DC

## 2023-01-09 MED ORDER — STERILE WATER FOR IRRIGATION IR SOLN
Status: DC | PRN
  Administered 2023-01-09: 60 mL

## 2023-01-09 MED ORDER — PROPOFOL 10 MG/ML IV BOLUS
INTRAVENOUS | Status: DC | PRN
  Administered 2023-01-09: 50 mg via INTRAVENOUS
  Administered 2023-01-09: 100 mg via INTRAVENOUS

## 2023-01-09 SURGICAL SUPPLY — 6 items
GOWN CVR UNV OPN BCK APRN NK (MISCELLANEOUS) ×2 IMPLANT
GOWN ISOL THUMB LOOP REG UNIV (MISCELLANEOUS) ×2
KIT PRC NS LF DISP ENDO (KITS) ×1 IMPLANT
KIT PROCEDURE OLYMPUS (KITS) ×1
MANIFOLD NEPTUNE II (INSTRUMENTS) ×1 IMPLANT
WATER STERILE IRR 250ML POUR (IV SOLUTION) ×1 IMPLANT

## 2023-01-09 NOTE — Transfer of Care (Signed)
Immediate Anesthesia Transfer of Care Note  Patient: Courtney Mccarty  Procedure(s) Performed: COLONOSCOPY WITH PROPOFOL (Rectum)  Patient Location: PACU  Anesthesia Type: General  Level of Consciousness: awake, alert  and patient cooperative  Airway and Oxygen Therapy: Patient Spontanous Breathing and Patient connected to supplemental oxygen  Post-op Assessment: Post-op Vital signs reviewed, Patient's Cardiovascular Status Stable, Respiratory Function Stable, Patent Airway and No signs of Nausea or vomiting  Post-op Vital Signs: Reviewed and stable  Complications: No notable events documented.

## 2023-01-09 NOTE — Op Note (Signed)
[facility] Patient Name: Courtney Mccarty Procedure Date: 01/09/2023 9:38 AM MRN: [id number] Account #: [id number] Date of Birth: 1961-02-20 Admit Type: Outpatient Age: 62 Room: [facility] OR ROOM 01 Gender: Female Note Status: Finalized Instrument Name: [id number] Procedure:             Colonoscopy Indications:           High risk colon cancer surveillance: Personal history                         of colonic polyps Providers:             Midge Minium MD, MD Referring MD:          Lorre Munroe (Referring MD) Medicines:             Propofol per Anesthesia Complications:         No immediate complications. Procedure:             Pre-Anesthesia Assessment:                        - Prior to the procedure, a History and Physical was                         performed, and patient medications and allergies were                         reviewed. The patient's tolerance of previous                         anesthesia was also reviewed. The risks and benefits                         of the procedure and the sedation options and risks                         were discussed with the patient. All questions were                         answered, and informed consent was obtained. Prior                         Anticoagulants: The patient has taken no anticoagulant                         or antiplatelet agents. ASA Grade Assessment: II - A                         patient with mild systemic disease. After reviewing                         the risks and benefits, the patient was deemed in                         satisfactory condition to undergo the procedure.                        After obtaining informed consent, the colonoscope was  passed under direct vision. Throughout the procedure,                         the patient's blood pressure, pulse, and oxygen                         saturations were monitored continuously. The                          Colonoscope was introduced through the anus and                         advanced to the the cecum, identified by appendiceal                         orifice and ileocecal valve. The colonoscopy was                         performed without difficulty. The patient tolerated                         the procedure well. The quality of the bowel                         preparation was adequate to identify polyps. Findings:      The perianal and digital rectal examinations were normal.      Non-bleeding internal hemorrhoids were found during retroflexion. The       hemorrhoids were Grade I (internal hemorrhoids that do not prolapse). Impression:            - Non-bleeding internal hemorrhoids.                        - No specimens collected. Recommendation:        - Discharge patient to home.                        - Resume previous diet.                        - Continue present medications.                        - Repeat colonoscopy in 5 years for surveillance. Procedure Code(s):     --- Professional ---                        [phone], Colonoscopy, flexible; diagnostic, including                         collection of specimen(s) by brushing or washing, when                         performed (separate procedure) Diagnosis Code(s):     --- Professional ---                        Z86.010, Personal history of colonic polyps CPT copyright 2022 American Medical Association. All rights reserved. The codes documented in this report are preliminary and upon coder review may  be revised to meet current  compliance requirements. Midge Minium MD, MD 01/09/2023 9:59:54 AM This report has been signed electronically. Number of Addenda: 0 Note Initiated On: 01/09/2023 9:38 AM Scope Withdrawal Time: 0 hours 7 minutes 30 seconds  Total Procedure Duration: 0 hours 13 minutes 27 seconds  Estimated Blood Loss:  Estimated blood loss: none.      [facility]

## 2023-01-09 NOTE — H&P (Signed)
Midge Minium, MD [facility] [address], [address] [place], [place] [address] Phone:[phone] Fax : [phone]  Primary Care Physician:  Lorre Munroe, NP Primary Gastroenterologist:  Dr. Servando Snare  Pre-Procedure History & Physical: HPI:  Courtney Mccarty is a 62 y.o. female is here for an colonoscopy.   Past Medical History:  Diagnosis Date   Essential hypertension 10/11/2016   Hyperlipidemia    Type 2 diabetes mellitus [facility])     Past Surgical History:  Procedure Laterality Date   BREAST BIOPSY Left 09/02/2021   stereo bx calcs, coil marker, path pending   BREAST BIOPSY Right 09/02/2021   stereo bx calcs, x marker, path pending   COLONOSCOPY WITH PROPOFOL N/A 11/28/2016   Procedure: COLONOSCOPY WITH PROPOFOL;  Surgeon: Midge Minium, MD;  Location: [facility];  Service: Gastroenterology;  Laterality: N/A;   POLYPECTOMY N/A 11/28/2016   Procedure: POLYPECTOMY;  Surgeon: Midge Minium, MD;  Location: [facility];  Service: Gastroenterology;  Laterality: N/A;    Prior to Admission medications   Medication Sig Start Date End Date Taking? Authorizing Provider  cholecalciferol (VITAMIN D3) 25 MCG (1000 UNIT) tablet Take 1,000 Units by mouth daily.   Yes [provider]  glipiZIDE (GLUCOTROL) 5 MG tablet Take 1 tablet (5 mg total) by mouth 2 (two) times daily before a meal. 11/21/22  Yes Baity, Salvadore Oxford, NP  lisinopril-hydrochlorothiazide (ZESTORETIC) 10-12.5 MG tablet Take 1 tablet by mouth daily. 11/21/22  Yes Lorre Munroe, NP  lovastatin (MEVACOR) 10 MG tablet Take 1 tablet (10 mg total) by mouth at bedtime. 11/21/22  Yes Lorre Munroe, NP  metFORMIN (GLUCOPHAGE-XR) 750 MG 24 hr tablet Take 1 tablet (750 mg total) by mouth 2 (two) times daily. 11/21/22  Yes Lorre Munroe, NP  Multiple Vitamin (MULTIVITAMIN) tablet Take 1 tablet by mouth daily.   Yes [provider]    Allergies as of 11/28/2022   (No Known Allergies)    Family History  Problem  Relation Age of Onset   Diabetes Mother    Hypertension Mother    Depression Mother    Heart attack Mother 54   Diabetes Father    Hypertension Father    Diabetes Maternal Grandmother    Cancer Maternal Grandfather    Diabetes Paternal Grandmother    Cancer Paternal Grandfather     Social History   Socioeconomic History   Marital status: Single    Spouse name: Not on file   Number of children: Not on file   Years of education: Not on file   Highest education level: Not on file  Occupational History   Not on file  Tobacco Use   Smoking status: Never   Smokeless tobacco: Never  Vaping Use   Vaping status: Never Used  Substance and Sexual Activity   Alcohol use: Yes    Alcohol/week: 12.0 standard drinks of alcohol    Types: 12 Cans of beer per week    Comment: weekly   Drug use: No   Sexual activity: Not Currently    Birth control/protection: None  Other Topics Concern   Not on file  Social History Narrative   Not on file   Social Determinants of Health   Financial Resource Strain: Not on file  Food Insecurity: Not on file  Transportation Needs: Not on file  Physical Activity: Not on file  Stress: Not on file  Social Connections: Not on file  Intimate Partner Violence: Not on file    Review  of Systems: See HPI, otherwise negative ROS  Physical Exam: BP 132/83   Temp 97.9 F (36.6 C) (Temporal)   Resp 15   Ht 5' (1.524 m)   Wt 72.4 kg   LMP 04/05/2015 (Approximate)   SpO2 95%   BMI 31.17 kg/m  General:   Alert,  pleasant and cooperative in NAD Head:  Normocephalic and atraumatic. Neck:  Supple; no masses or thyromegaly. Lungs:  Clear throughout to auscultation.    Heart:  Regular rate and rhythm. Abdomen:  Soft, nontender and nondistended. Normal bowel sounds, without guarding, and without rebound.   Neurologic:  Alert and  oriented x4;  grossly normal neurologically.  Impression/Plan: Courtney Mccarty is here for an colonoscopy to be performed for  a history of adenomatous polyps on 2018   Risks, benefits, limitations, and alternatives regarding  colonoscopy have been reviewed with the patient.  Questions have been answered.  All parties agreeable.   Midge Minium, MD  01/09/2023, 8:54 AM

## 2023-01-09 NOTE — Anesthesia Postprocedure Evaluation (Signed)
Anesthesia Post Note  Patient: Courtney Mccarty  Procedure(s) Performed: COLONOSCOPY WITH PROPOFOL (Rectum)  Patient location during evaluation: PACU Anesthesia Type: General Level of consciousness: awake and alert Pain management: pain level controlled Vital Signs Assessment: post-procedure vital signs reviewed and stable Respiratory status: spontaneous breathing, nonlabored ventilation, respiratory function stable and patient connected to nasal cannula oxygen Cardiovascular status: blood pressure returned to baseline and stable Postop Assessment: no apparent nausea or vomiting Anesthetic complications: no   No notable events documented.   Last Vitals:  Vitals:   01/09/23 1003 01/09/23 1013  BP:  127/77  Pulse:  76  Resp:  17  Temp: 36.6 C 36.6 C  SpO2:  97%    Last Pain:  Vitals:   01/09/23 1013  TempSrc:   PainSc: 0-No pain                 Story Conti C Malijah Lietz

## 2023-01-11 ENCOUNTER — Other Ambulatory Visit: Payer: Self-pay | Admitting: Internal Medicine

## 2023-01-11 ENCOUNTER — Encounter: Payer: Self-pay | Admitting: Gastroenterology

## 2023-01-11 DIAGNOSIS — I10 Essential (primary) hypertension: Secondary | ICD-10-CM

## 2023-05-24 ENCOUNTER — Ambulatory Visit: Payer: 59 | Admitting: Internal Medicine

## 2023-05-24 NOTE — Progress Notes (Deleted)
 Subjective:    Patient ID: Courtney Mccarty, female    DOB: 07-Jan-1961, 63 y.o.   MRN: [id number]  HPI  Pt presents to the clinic today for follow up of chronic conditions.  HTN: Her BP today is 128/72. She is taking lisinopril HCTZ as prescribed. ECG from 10/2021 reviewed.  HLD: Her last LDL was 44, triglycerides 782, 11/2022. She is taking lovastatin and fish oil OTC. She tries to consume a low fat diet.  DM 2: Her last A1C was 6.2%, 11/2022. She is taking metformin and glipizide as prescribed. She does not check her sugars. She checks her feet routinely. Her las eye exam was more than 1 year ago. Flu never. Pneumovax never. Covid never.   Review of Systems     Past Medical History:  Diagnosis Date   Essential hypertension 10/11/2016   Hyperlipidemia    Type 2 diabetes mellitus (HCC)     Current Outpatient Medications  Medication Sig Dispense Refill   cholecalciferol (VITAMIN D3) 25 MCG (1000 UNIT) tablet Take 1,000 Units by mouth daily.     glipiZIDE (GLUCOTROL) 5 MG tablet Take 1 tablet (5 mg total) by mouth 2 (two) times daily before a meal. 180 tablet 1   lisinopril-hydrochlorothiazide (ZESTORETIC) 10-12.5 MG tablet Take 1 tablet by mouth daily. 90 tablet 1   lovastatin (MEVACOR) 10 MG tablet Take 1 tablet (10 mg total) by mouth at bedtime. 90 tablet 1   metFORMIN (GLUCOPHAGE-XR) 750 MG 24 hr tablet Take 1 tablet (750 mg total) by mouth 2 (two) times daily. 180 tablet 1   Multiple Vitamin (MULTIVITAMIN) tablet Take 1 tablet by mouth daily.     No current facility-administered medications for this visit.    No Known Allergies  Family History  Problem Relation Age of Onset   Diabetes Mother    Hypertension Mother    Depression Mother    Heart attack Mother 45   Diabetes Father    Hypertension Father    Diabetes Maternal Grandmother    Cancer Maternal Grandfather    Diabetes Paternal Grandmother    Cancer Paternal Grandfather     Social History   Socioeconomic  History   Marital status: Single    Spouse name: Not on file   Number of children: Not on file   Years of education: Not on file   Highest education level: Not on file  Occupational History   Not on file  Tobacco Use   Smoking status: Never   Smokeless tobacco: Never  Vaping Use   Vaping status: Never Used  Substance and Sexual Activity   Alcohol use: Yes    Alcohol/week: 12.0 standard drinks of alcohol    Types: 12 Cans of beer per week    Comment: weekly   Drug use: No   Sexual activity: Not Currently    Birth control/protection: None  Other Topics Concern   Not on file  Social History Narrative   Not on file   Social Drivers of Health   Financial Resource Strain: Not on file  Food Insecurity: Not on file  Transportation Needs: Not on file  Physical Activity: Not on file  Stress: Not on file  Social Connections: Not on file  Intimate Partner Violence: Not on file     Constitutional: Denies fever, malaise, fatigue, headache or abrupt weight changes.  HEENT: Denies eye pain, eye redness, ear pain, ringing in the ears, wax buildup, runny nose, nasal congestion, bloody nose, or sore throat. Respiratory: Denies difficulty  breathing, shortness of breath, cough or sputum production.   Cardiovascular: Denies chest pain, chest tightness, palpitations or swelling in the hands or feet.  Gastrointestinal: Denies abdominal pain, bloating, constipation, diarrhea or blood in the stool.  GU: Denies urgency, frequency, pain with urination, burning sensation, blood in urine, odor or discharge. Musculoskeletal: Denies decrease in range of motion, difficulty with gait, muscle pain or joint pain and swelling.  Skin: Denies redness, rashes, lesions or ulcercations.  Neurological: Denies dizziness, difficulty with memory, difficulty with speech or problems with balance and coordination.  Psych: Denies anxiety, depression, SI/HI.  No other specific complaints in a complete review of  systems (except as listed in HPI above).  Objective:   Physical Exam  LMP 04/05/2015 (Approximate)   Wt Readings from Last 3 Encounters:  01/09/23 159 lb 9.6 oz (72.4 kg)  11/21/22 159 lb (72.1 kg)  08/18/22 165 lb (74.8 kg)    General: Appears her stated age, obese, in NAD. Skin: Warm, dry and intact. No ulcerations noted. HEENT: Head: normal shape and size; Eyes: sclera white, no icterus, conjunctiva pink, PERRLA and EOMs intact;  Cardiovascular: Normal rate and rhythm. S1,S2 noted.  No murmur, rubs or gallops noted. No JVD or BLE edema. No carotid bruits noted. Pulmonary/Chest: Normal effort and positive vesicular breath sounds. No respiratory distress. No wheezes, rales or ronchi noted.  Abdomen: Soft and nontender. Normal bowel sounds.  Musculoskeletal: No difficulty with gait.  Neurological: Alert and oriented.    BMET    Component Value Date/Time   NA 140 11/21/2022 0831   NA 140 12/07/2018 1009   K 3.8 11/21/2022 0831   CL 104 11/21/2022 0831   CO2 27 11/21/2022 0831   GLUCOSE 143 (H) 11/21/2022 0831   BUN 14 11/21/2022 0831   BUN 12 12/07/2018 1009   CREATININE 0.68 11/21/2022 0831   CALCIUM 10.0 11/21/2022 0831   GFRNONAA 84 04/22/2020 0832   GFRAA 97 04/22/2020 0832    Lipid Panel     Component Value Date/Time   CHOL 116 11/21/2022 0831   TRIG 362 (H) 11/21/2022 0831   HDL 34 (L) 11/21/2022 0831   CHOLHDL 3.4 11/21/2022 0831   VLDL 58 (H) 11/08/2016 0947   LDLCALC 44 11/21/2022 0831    CBC    Component Value Date/Time   WBC 8.1 11/21/2022 0831   RBC 4.45 11/21/2022 0831   HGB 13.0 11/21/2022 0831   HCT 39.0 11/21/2022 0831   PLT 227 11/21/2022 0831   MCV 87.6 11/21/2022 0831   MCH 29.2 11/21/2022 0831   MCHC 33.3 11/21/2022 0831   RDW 12.5 11/21/2022 0831   LYMPHSABS 2,714 05/23/2022 0842   MONOABS 360 11/08/2016 0947   EOSABS 262 05/23/2022 0842   BASOSABS 41 05/23/2022 0842    Hgb A1C Lab Results  Component Value Date   HGBA1C 6.2  (H) 11/21/2022            Assessment & Plan:   RTC in 6 months for your annual exam  Nicki Reaper, NP

## 2023-06-06 ENCOUNTER — Ambulatory Visit: Payer: Self-pay | Admitting: Internal Medicine

## 2023-06-06 ENCOUNTER — Encounter: Payer: Self-pay | Admitting: Internal Medicine

## 2023-06-06 ENCOUNTER — Telehealth: Payer: Self-pay

## 2023-06-06 VITALS — BP 118/62 | Ht 60.0 in | Wt 161.0 lb

## 2023-06-06 DIAGNOSIS — Z113 Encounter for screening for infections with a predominantly sexual mode of transmission: Secondary | ICD-10-CM

## 2023-06-06 DIAGNOSIS — E119 Type 2 diabetes mellitus without complications: Secondary | ICD-10-CM

## 2023-06-06 DIAGNOSIS — L508 Other urticaria: Secondary | ICD-10-CM | POA: Diagnosis not present

## 2023-06-06 DIAGNOSIS — E6609 Other obesity due to excess calories: Secondary | ICD-10-CM

## 2023-06-06 DIAGNOSIS — Z6831 Body mass index (BMI) 31.0-31.9, adult: Secondary | ICD-10-CM

## 2023-06-06 DIAGNOSIS — K760 Fatty (change of) liver, not elsewhere classified: Secondary | ICD-10-CM

## 2023-06-06 DIAGNOSIS — Z7984 Long term (current) use of oral hypoglycemic drugs: Secondary | ICD-10-CM

## 2023-06-06 DIAGNOSIS — T7840XD Allergy, unspecified, subsequent encounter: Secondary | ICD-10-CM

## 2023-06-06 DIAGNOSIS — E1169 Type 2 diabetes mellitus with other specified complication: Secondary | ICD-10-CM

## 2023-06-06 DIAGNOSIS — I10 Essential (primary) hypertension: Secondary | ICD-10-CM | POA: Diagnosis not present

## 2023-06-06 DIAGNOSIS — E1165 Type 2 diabetes mellitus with hyperglycemia: Secondary | ICD-10-CM | POA: Diagnosis not present

## 2023-06-06 DIAGNOSIS — E785 Hyperlipidemia, unspecified: Secondary | ICD-10-CM

## 2023-06-06 DIAGNOSIS — E66811 Obesity, class 1: Secondary | ICD-10-CM

## 2023-06-06 MED ORDER — METFORMIN HCL ER 750 MG PO TB24: 750.0000 mg | ORAL_TABLET | Freq: Two times a day (BID) | ORAL | 1 refills | Status: DC

## 2023-06-06 MED ORDER — LISINOPRIL-HYDROCHLOROTHIAZIDE 10-12.5 MG PO TABS: 1.0000 | ORAL_TABLET | Freq: Every day | ORAL | 1 refills | Status: DC

## 2023-06-06 MED ORDER — LOVASTATIN 10 MG PO TABS: 10.0000 mg | ORAL_TABLET | Freq: Every day | ORAL | 1 refills | Status: DC

## 2023-06-06 MED ORDER — GLIPIZIDE 5 MG PO TABS: 5.0000 mg | ORAL_TABLET | Freq: Two times a day (BID) | ORAL | 1 refills | Status: DC

## 2023-06-06 NOTE — Telephone Encounter (Signed)
 Copied from CRM [id number]. Topic: General - Other >> Jun 06, 2023 10:32 AM Tiffany B wrote: Reason for CRM: Patient would like a print out of her AVS, patient will pick up after lunch today or tomorrow morning.

## 2023-06-06 NOTE — Assessment & Plan Note (Addendum)
 A1c Courtney Mccarty and urine microalbumin today Encouraged her to consume a low-carb diet and exercise for weight loss Continue metformin and glipizide.  She reports she is trying to wean herself off the metformin. Advised her to schedule an appointment for an eye exam Encouraged routine foot exams She declines flu shot She declines pneumonia vaccine She declines COVID-vaccine

## 2023-06-06 NOTE — Assessment & Plan Note (Signed)
C-Met today Encourage diet and exercise for weight loss 

## 2023-06-06 NOTE — Assessment & Plan Note (Signed)
 Encourage diet and exercise for weight loss

## 2023-06-06 NOTE — Assessment & Plan Note (Signed)
 Lipid profile today Encouraged her to consume low-fat diet Advised her to continue lovastatin and fish oil

## 2023-06-06 NOTE — Patient Instructions (Signed)
 Hives Hives are itchy, red, swollen areas on your skin. They can show up on any part of your body. They often go away within 24 hours (acute hives). If you get new hives after the old ones fade and this goes on for many days or weeks, it is called chronic hives. Hives do not spread from person to person (are not contagious). Hives can happen when your body reacts to something that you are allergic to (allergen). These are sometimes called triggers. You can get hives right after being around a trigger, or hours later. What are the causes? Food allergies. Insect bites or stings. Allergies to pollen or pets. Spending time in sunlight, heat, or cold. Exercise. Stress. Other causes, such as: Viruses. This includes the common cold. Infections caused by germs (bacteria). Some medicines. Chemicals or latex. Allergy shots. Blood transfusions. In some cases, the cause is not known. What increases the risk? Being female. Being allergic to foods, such as: Citrus fruits. Milk. Eggs. Peanuts. Tree nuts. Shellfish. Being allergic to: Medicines. Latex. Insects. Animals. Pollen. What are the signs or symptoms?  Itchy, red or white bumps or spots on your skin. These areas may: Swell and get bigger. Change in shape and location. Stand alone or connect to each other over a large area of skin. Sting or hurt. Turn white when pressed in the center (blanch). In very bad cases, your hands, feet, and face may also swell. This may happen if hives start deeper in your skin. How is this treated? Treatment for hives depends on your symptoms. You may need to: Use cool, wet cloths (cool compresses) or take cool showers to stop the itching. Take or apply medicines to: Help with itching (antihistamines). Lessen swelling (corticosteroids). Treat infection (antibiotics). Have a medicine called omalizumab given to you as a shot. You may need this if your hives do not get better with other treatments. In  very bad cases, you may need to use a device filled with medicine that gives an emergency shot of epinephrine (auto-injector pen) to stop a very bad allergic reaction (anaphylactic reaction). Follow these instructions at home: Medicines Take or apply over-the-counter and prescription medicines only as told by your doctor. If you were prescribed antibiotics, use them as told by your doctor. Do not stop using them even if you start to feel better. Skin care Put cool, wet cloths on the hives. Do not scratch your skin. Do not rub your skin. General instructions Do not take hot showers or baths. This can make itching worse. Do not wear tight clothes. Use sunscreen. Wear clothes that cover your skin when you are outside. Avoid triggers that cause your hives. Keep a journal to help track what causes your hives. Write down: What medicines you take. What you eat and drink. What you put on your skin. Keep all follow-up visits. Your doctor will need to make sure treatment is working. Contact a doctor if: Your symptoms do not get better with medicine. Your joints hurt or swell. You have a fever. You have pain in your belly (abdomen). Get help right away if: Your tongue or lips swell. Your eyelids are swollen. Your chest or throat feels tight. You have trouble breathing or swallowing. These symptoms may be an emergency. Get help right away. Call 911. Do not wait to see if the symptoms will go away. Do not drive yourself to the hospital. This information is not intended to replace advice given to you by your health care provider. Make sure  you discuss any questions you have with your health care provider. Document Revised: 12/07/2021 Document Reviewed: 12/07/2021 Elsevier Patient Education  2024 [employer].

## 2023-06-06 NOTE — Assessment & Plan Note (Signed)
 Advised her to start a daily antihistamine such as Claritin, Allegra or Zyrtec Referral to allergist for further evaluation and treatment

## 2023-06-06 NOTE — Progress Notes (Signed)
 Subjective:    Patient ID: Courtney Mccarty, female    DOB: 02/09/1961, 63 y.o.   MRN: [id number]  HPI  Pt presents to the clinic today for follow up of chronic conditions.  HTN: Her BP today is 118/62. She is taking lisinopril HCTZ as prescribed. ECG from 10/2021 reviewed.  HLD: Her last LDL was 44, triglycerides 213, 11/2022. She is taking lovastatin and fish oil OTC. She tries to consume a low fat diet.  DM 2: Her last A1C was 6.2%, 11/2022. She is taking metformin and glipizide as prescribed. She does not check her sugars. She checks her feet routinely. Her las eye exam was more than 1 year ago. Flu never. Pneumovax never. Covid never.  She also reports recurrent hives.  She recently had an ER visit for the same.  She is not currently taking any daily antihistamines OTC.  She is not sure exactly what she is allergic to.  She is a would like referral for allergy testing for further evaluation.  Fatty liver disease: Her last AST/ALT was 24/25, 11/2022.  RUQ abdominal ultrasound from 07/2021 reviewed.  She would also like STD testing today.  She reports she is not sexually active.  Review of Systems     Past Medical History:  Diagnosis Date   Essential hypertension 10/11/2016   Hyperlipidemia    Type 2 diabetes mellitus (HCC)     Current Outpatient Medications  Medication Sig Dispense Refill   cholecalciferol (VITAMIN D3) 25 MCG (1000 UNIT) tablet Take 1,000 Units by mouth daily.     glipiZIDE (GLUCOTROL) 5 MG tablet Take 1 tablet (5 mg total) by mouth 2 (two) times daily before a meal. 180 tablet 1   lisinopril-hydrochlorothiazide (ZESTORETIC) 10-12.5 MG tablet Take 1 tablet by mouth daily. 90 tablet 1   lovastatin (MEVACOR) 10 MG tablet Take 1 tablet (10 mg total) by mouth at bedtime. 90 tablet 1   metFORMIN (GLUCOPHAGE-XR) 750 MG 24 hr tablet Take 1 tablet (750 mg total) by mouth 2 (two) times daily. 180 tablet 1   Multiple Vitamin (MULTIVITAMIN) tablet Take 1 tablet by mouth  daily.     No current facility-administered medications for this visit.    No Known Allergies  Family History  Problem Relation Age of Onset   Diabetes Mother    Hypertension Mother    Depression Mother    Heart attack Mother 61   Diabetes Father    Hypertension Father    Diabetes Maternal Grandmother    Cancer Maternal Grandfather    Diabetes Paternal Grandmother    Cancer Paternal Grandfather     Social History   Socioeconomic History   Marital status: Single    Spouse name: Not on file   Number of children: Not on file   Years of education: Not on file   Highest education level: Not on file  Occupational History   Not on file  Tobacco Use   Smoking status: Never   Smokeless tobacco: Never  Vaping Use   Vaping status: Never Used  Substance and Sexual Activity   Alcohol use: Yes    Alcohol/week: 12.0 standard drinks of alcohol    Types: 12 Cans of beer per week    Comment: weekly   Drug use: No   Sexual activity: Not Currently    Birth control/protection: None  Other Topics Concern   Not on file  Social History Narrative   Not on file   Social Drivers of Health   Financial  Resource Strain: Not on file  Food Insecurity: Not on file  Transportation Needs: Not on file  Physical Activity: Not on file  Stress: Not on file  Social Connections: Not on file  Intimate Partner Violence: Not on file     Constitutional: Denies fever, malaise, fatigue, headache or abrupt weight changes.  HEENT: Denies eye pain, eye redness, ear pain, ringing in the ears, wax buildup, runny nose, nasal congestion, bloody nose, or sore throat. Respiratory: Denies difficulty breathing, shortness of breath, cough or sputum production.   Cardiovascular: Denies chest pain, chest tightness, palpitations or swelling in the hands or feet.  Gastrointestinal: Denies abdominal pain, bloating, constipation, diarrhea or blood in the stool.  GU: Denies urgency, frequency, pain with urination,  burning sensation, blood in urine, odor or discharge. Musculoskeletal: Denies decrease in range of motion, difficulty with gait, muscle pain or joint pain and swelling.  Skin: Patient reports recurrent hives.  Denies lesions or ulcercations.  Neurological: Denies dizziness, difficulty with memory, difficulty with speech or problems with balance and coordination.  Psych: Denies anxiety, depression, SI/HI.  No other specific complaints in a complete review of systems (except as listed in HPI above).  Objective:   Physical Exam  BP 118/62 (BP Location: Left Arm, Patient Position: Sitting, Cuff Size: Normal)   Ht 5' (1.524 m)   Wt 161 lb (73 kg)   LMP 04/05/2015 (Approximate)   BMI 31.44 kg/m    Wt Readings from Last 3 Encounters:  01/09/23 159 lb 9.6 oz (72.4 kg)  11/21/22 159 lb (72.1 kg)  08/18/22 165 lb (74.8 kg)    General: Appears her stated age, obese, in NAD. Skin: Warm, dry and intact. No hives or ulcerations noted. HEENT: Head: normal shape and size; Eyes: sclera white, no icterus, conjunctiva pink, PERRLA and EOMs intact;  Cardiovascular: Normal rate and rhythm. S1,S2 noted.  No murmur, rubs or gallops noted. No JVD or BLE edema. No carotid bruits noted. Pulmonary/Chest: Normal effort and positive vesicular breath sounds. No respiratory distress. No wheezes, rales or ronchi noted.  Musculoskeletal: No difficulty with gait.  Neurological: Alert and oriented.    BMET    Component Value Date/Time   NA 140 11/21/2022 0831   NA 140 12/07/2018 1009   K 3.8 11/21/2022 0831   CL 104 11/21/2022 0831   CO2 27 11/21/2022 0831   GLUCOSE 143 (H) 11/21/2022 0831   BUN 14 11/21/2022 0831   BUN 12 12/07/2018 1009   CREATININE 0.68 11/21/2022 0831   CALCIUM 10.0 11/21/2022 0831   GFRNONAA 84 04/22/2020 0832   GFRAA 97 04/22/2020 0832    Lipid Panel     Component Value Date/Time   CHOL 116 11/21/2022 0831   TRIG 362 (H) 11/21/2022 0831   HDL 34 (L) 11/21/2022 0831    CHOLHDL 3.4 11/21/2022 0831   VLDL 58 (H) 11/08/2016 0947   LDLCALC 44 11/21/2022 0831    CBC    Component Value Date/Time   WBC 8.1 11/21/2022 0831   RBC 4.45 11/21/2022 0831   HGB 13.0 11/21/2022 0831   HCT 39.0 11/21/2022 0831   PLT 227 11/21/2022 0831   MCV 87.6 11/21/2022 0831   MCH 29.2 11/21/2022 0831   MCHC 33.3 11/21/2022 0831   RDW 12.5 11/21/2022 0831   LYMPHSABS 2,714 05/23/2022 0842   MONOABS 360 11/08/2016 0947   EOSABS 262 05/23/2022 0842   BASOSABS 41 05/23/2022 0842    Hgb A1C Lab Results  Component Value Date  HGBA1C 6.2 (H) 11/21/2022            Assessment & Plan:   RTC in 6 months for your annual exam  Nicki Reaper, NP

## 2023-06-06 NOTE — Assessment & Plan Note (Addendum)
 Controlled on lisinopril HCT Reinforced at diet and exercise for weight loss C-Met today

## 2023-06-07 ENCOUNTER — Encounter: Payer: Self-pay | Admitting: Internal Medicine

## 2023-06-07 ENCOUNTER — Ambulatory Visit: Attending: Internal Medicine | Admitting: Internal Medicine

## 2023-06-07 VITALS — BP 122/76 | HR 68 | Ht 60.0 in | Wt 163.8 lb

## 2023-06-07 DIAGNOSIS — R072 Precordial pain: Secondary | ICD-10-CM | POA: Diagnosis not present

## 2023-06-07 DIAGNOSIS — E785 Hyperlipidemia, unspecified: Secondary | ICD-10-CM | POA: Diagnosis not present

## 2023-06-07 DIAGNOSIS — E1169 Type 2 diabetes mellitus with other specified complication: Secondary | ICD-10-CM

## 2023-06-07 DIAGNOSIS — I10 Essential (primary) hypertension: Secondary | ICD-10-CM | POA: Diagnosis not present

## 2023-06-07 LAB — COMPLETE METABOLIC PANEL WITH GFR
AG Ratio: 1.5 (calc) (ref 1.0–2.5)
ALT: 17 U/L (ref 6–29)
AST: 15 U/L (ref 10–35)
Albumin: 4.4 g/dL (ref 3.6–5.1)
Alkaline phosphatase (APISO): 73 U/L (ref 37–153)
BUN: 15 mg/dL (ref 7–25)
CO2: 26 mmol/L (ref 20–32)
Calcium: 9.8 mg/dL (ref 8.6–10.4)
Chloride: 103 mmol/L (ref 98–110)
Creat: 0.72 mg/dL (ref 0.50–1.05)
Globulin: 3 g/dL (ref 1.9–3.7)
Glucose, Bld: 121 mg/dL — ABNORMAL HIGH (ref 65–99)
Potassium: 3.9 mmol/L (ref 3.5–5.3)
Sodium: 141 mmol/L (ref 135–146)
Total Bilirubin: 0.3 mg/dL (ref 0.2–1.2)
Total Protein: 7.4 g/dL (ref 6.1–8.1)
eGFR: 94 mL/min/{1.73_m2} (ref >=60)

## 2023-06-07 LAB — HEMOGLOBIN A1C
Hgb A1c MFr Bld: 6.4 %{Hb} — ABNORMAL HIGH (ref <5.7)
Mean Plasma Glucose: 137 mg/dL
eAG (mmol/L): 7.6 mmol/L

## 2023-06-07 LAB — MICROALBUMIN / CREATININE URINE RATIO
Creatinine, Urine: 99 mg/dL (ref 20–275)
Microalb Creat Ratio: 2 mg/g{creat} (ref <30)
Microalb, Ur: 0.2 mg/dL

## 2023-06-07 LAB — CBC
HCT: 38.3 % (ref 35.0–45.0)
Hemoglobin: 12.5 g/dL (ref 11.7–15.5)
MCH: 28.4 pg (ref 27.0–33.0)
MCHC: 32.6 g/dL (ref 32.0–36.0)
MCV: 87 fL (ref 80.0–100.0)
MPV: 11.7 fL (ref 7.5–12.5)
Platelets: 247 10*3/uL (ref 140–400)
RBC: 4.4 10*6/uL (ref 3.80–5.10)
RDW: 12.1 % (ref 11.0–15.0)
WBC: 8.3 10*3/uL (ref 3.8–10.8)

## 2023-06-07 LAB — LIPID PANEL
Cholesterol: 106 mg/dL (ref <200)
HDL: 33 mg/dL — ABNORMAL LOW (ref >=50)
LDL Cholesterol (Calc): 51 mg/dL
Non-HDL Cholesterol (Calc): 73 mg/dL (ref <130)
Total CHOL/HDL Ratio: 3.2 (calc) (ref <5.0)
Triglycerides: 138 mg/dL (ref <150)

## 2023-06-07 LAB — HIV ANTIBODY (ROUTINE TESTING W REFLEX): HIV 1&2 Ab, 4th Generation: NONREACTIVE

## 2023-06-07 LAB — HEPATITIS C ANTIBODY: Hepatitis C Ab: NONREACTIVE

## 2023-06-07 LAB — RPR: RPR Ser Ql: NONREACTIVE

## 2023-06-07 MED ORDER — METOPROLOL TARTRATE 25 MG PO TABS: ORAL_TABLET | ORAL | 0 refills | Status: DC

## 2023-06-07 NOTE — Patient Instructions (Addendum)
 Medication Instructions:  Your physician recommends that you continue on your current medications as directed. Please refer to the Current Medication list given to you today.   *If you need a refill on your cardiac medications before your next appointment, please call your pharmacy*   Lab Work: Your provider would like for you to have following labs drawn today (BMP).     Testing/Procedures: Cardiac CT Angiography (CTA), is a special type of CT scan that uses a computer to produce multi-dimensional views of major blood vessels throughout the body. In CT angiography, a contrast material is injected through an IV to help visualize the blood vessels  Please see instructions below   Follow-Up: At [facility], you and your health needs are our priority.  As part of our continuing mission to provide you with exceptional heart care, we have created designated Provider Care Teams.  These Care Teams include your primary Cardiologist (physician) and Advanced Practice Providers (APPs -  Physician Assistants and Nurse Practitioners) who all work together to provide you with the care you need, when you need it.  We recommend signing up for the patient portal called "MyChart".  Sign up information is provided on this After Visit Summary.  MyChart is used to connect with patients for Virtual Visits (Telemedicine).  Patients are able to view lab/test results, encounter notes, upcoming appointments, etc.  Non-urgent messages can be sent to your provider as well.   To learn more about what you can do with MyChart, go to [email or web address].    Your next appointment:   As needed   Provider:   You may see Yvonne Kendall, MD or one of the following Advanced Practice Providers on your designated Care Team:   Nicolasa Ducking, NP Eula Listen, PA-C Cadence Fransico Michael, PA-C Charlsie Quest, NP Carlos Levering, NP      Your cardiac CT will be scheduled at one of the below locations:     [facility] [address] [place], [place] [address] [phone]  OR   [facility] [address] [place], [place] [address] [phone]  OR   [facility] [address] [place], [place] [address] [phone]  If scheduled at [facility], please arrive at the [facility] and Children's Entrance (Entrance C2) of [facility] 30 minutes prior to test start time. You can use the FREE valet parking offered at entrance C (encouraged to control the heart rate for the test)  Proceed to the [facility] Radiology Department (first floor) to check-in and test prep.  All radiology patients and guests should use entrance C2 at [facility], accessed from [facility], even though the hospital's physical address listed is [address]    If scheduled at [facility] or [facility], please arrive 15 mins early for check-in and test prep.  There is spacious parking and easy access to the radiology department from the [facility] entrance. Please enter here and check-in with the desk attendant.   If scheduled at [facility], please arrive 30 minutes early for check-in and test prep.  Please follow these instructions carefully (unless otherwise directed):  An IV will be required for this test and Nitroglycerin will be given.   On the Night Before the Test: Be sure to Drink plenty of water. Do not consume any caffeinated/decaffeinated beverages or chocolate 12 hours prior to your test. Do  not take any antihistamines 12 hours prior to your test.  On the Day of the Test: Drink plenty of water until 1 hour prior to the test. Do not eat any food 1 hour prior to test. You may take your regular medications prior to the test.  Take metoprolol (Lopressor) 25 mg  two hours prior to test. If you take  Furosemide/Hydrochlorothiazide/Spironolactone/Chlorthalidone, please HOLD on the morning of the test. Patients who wear a continuous glucose monitor MUST remove the device prior to scanning. FEMALES- please wear underwire-free bra if available, avoid dresses & tight clothing       After the Test: Drink plenty of water. After receiving IV contrast, you may experience a mild flushed feeling. This is normal. On occasion, you may experience a mild rash up to 24 hours after the test. This is not dangerous. If this occurs, you can take Benadryl 25 mg, Zyrtec, Claritin, or Allegra and increase your fluid intake. (Patients taking Tikosyn should avoid Benadryl, and may take Zyrtec, Claritin, or Allegra) If you experience trouble breathing, this can be serious. If it is severe call 911 IMMEDIATELY. If it is mild, please call our office.  We will call to schedule your test 2-4 weeks out understanding that some insurance companies will need an authorization prior to the service being performed.   For more information and frequently asked questions, please visit our website : [email or web address]  For non-scheduling related questions, please contact the cardiac imaging nurse navigator should you have any questions/concerns: Cardiac Imaging Nurse Navigators Direct Office Dial: [phone]   For scheduling needs, including cancellations and rescheduling, please call [place], [phone].

## 2023-06-07 NOTE — Progress Notes (Unsigned)
 Cardiology Office Note:  .   Date:  06/08/2023  ID:  Courtney Mccarty, DOB [birth year], MRN [id number] PCP: Lorre Munroe, NP  [facility] Providers Cardiologist:  Yvonne Kendall, MD     History of Present Illness: .   Courtney Mccarty is a 63 y.o. female history of hypertension, hyperlipidemia, type 2 diabetes mellitus, and obesity, who presents for follow-up of chest pain.  She was last seen in our office in 10/2021, at which time she reported an episode of chest pain and dyspnea after changing a tire.  Preceding coronary CTA was reassuring without evidence of CAD.  No medication changes or additional testing were recommended.  Patient was lost to follow-up until today.  About a month ago, she reports onset of pressure-like pain across her chest that occurred while she was at work.  She was not moving at the time and but felt like it was difficult for her to bend forward after the pain began.  She sat down and rested, with the pain gradually resolving over the course of 2 hours.  She is very concerned that this could be related to her heart.  She had not done anything out of the ordinary leading up to onset of chest pain.  She denies trauma to the chest as well.  She also notes intermittent pulsatile feeling in sounds in her head that have been present off and on for few months.  She denies frank palpitations as well as lightheadedness and edema.  ROS: See HPI  Studies Reviewed: Marland Kitchen   EKG Interpretation Date/Time:  Wednesday June 07 2023 16:05:07 EST Ventricular Rate:  68 PR Interval:  138 QRS Duration:  78 QT Interval:  410 QTC Calculation: 435 R Axis:   4  Text Interpretation: Normal sinus rhythm Low voltage QRS Borderline ECG When compared with ECG of 27-Oct-2021 Criteria for Possible Anterior infarct are no longer Present Confirmed by Courtney Mccarty, Cristal Deer [phone]) on 06/08/2023 1:13:57 PM    Risk Assessment/Calculations:            Physical Exam:   VS:  BP 122/76   Pulse 68   Ht 5'  (1.524 m)   Wt 163 lb 12.8 oz (74.3 kg)   LMP 04/05/2015 (Approximate)   SpO2 99%   BMI 31.99 kg/m    Wt Readings from Last 3 Encounters:  06/07/23 163 lb 12.8 oz (74.3 kg)  06/06/23 161 lb (73 kg)  01/09/23 159 lb 9.6 oz (72.4 kg)    General:  NAD. Neck: No JVD or HJR. Lungs: Clear to auscultation bilaterally without wheezes or crackles. Heart: Regular rate and rhythm without murmurs, rubs, or gallops. Abdomen: Soft, nontender, nondistended. Extremities: No lower extremity edema.  ASSESSMENT AND PLAN: .    Chest pain: [name] reports episode of recurrent chest pain that lasted about 2 hours while at work.  It was not exertional but concerning to her given its pressure-like nature across her chest.  She has not had any recurrence since.  Her physical exam and EKG today are unremarkable.  Coronary CTA in 2022 was normal.  I suspect her pain is noncardiac in nature, though [name] remains concerned about atherosclerotic cardiovascular disease given her multiple risk factors (hypertension, hyperlipidemia, diabetes mellitus, and obesity).  We have therefore agreed to repeat a coronary CTA to ensure that she has not developed any CAD since her last study.  We discussed potential for coronary vasospasm and pharmacotherapy using a nondihydropyridine calcium channel blocker or long-acting nitrate.  However, [name] wishes to defer adding a medicine at this time pending results of her CT scan.  Hypertension: Blood pressure well-controlled today.  We will continue her current dose of lisinopril-HCTZ.  Hyperlipidemia associated with type 2 diabetes mellitus: Continue lovastatin for now, though escalation to moderate-high intensity statin therapy in the setting of diabetes mellitus should be considered in the future sure, particularly if there is evidence of ASCVD on her CTA.  Notably, most recent lipid panel earlier this month showed an excellent LDL of 51.    Dispo: Return to clinic as  needed based on symptoms and results of CTA.  Signed, Yvonne Kendall, MD

## 2023-06-08 ENCOUNTER — Encounter: Payer: Self-pay | Admitting: Internal Medicine

## 2023-06-08 DIAGNOSIS — R072 Precordial pain: Secondary | ICD-10-CM | POA: Insufficient documentation

## 2023-06-13 ENCOUNTER — Telehealth (HOSPITAL_COMMUNITY): Payer: Self-pay | Admitting: *Deleted

## 2023-06-13 NOTE — Telephone Encounter (Signed)
 Attempted to call patient regarding upcoming cardiac CT appointment. Left message on voicemail with name and callback number  Larey Brick RN Navigator Cardiac Imaging [facility] [phone] Office [phone] Cell

## 2023-06-15 ENCOUNTER — Ambulatory Visit: Admission: RE | Admit: 2023-06-15 | Source: Ambulatory Visit

## 2023-06-28 ENCOUNTER — Telehealth (HOSPITAL_COMMUNITY): Payer: Self-pay | Admitting: *Deleted

## 2023-06-28 NOTE — Telephone Encounter (Signed)
 Attempted to call patient regarding upcoming cardiac CT appointment. Left message on voicemail with name and callback number  Larey Brick RN Navigator Cardiac Imaging [facility] [phone] Office [phone] Cell

## 2023-06-28 NOTE — Telephone Encounter (Signed)
 Patient returning call about her upcoming cardiac imaging study; pt verbalizes understanding of appt date/time, parking situation and where to check in, pre-test NPO status and medications ordered, and verified current allergies; name and call back number provided for further questions should they arise  Larey Brick RN Navigator Cardiac Imaging [facility] [phone] office [phone] cell  Patient to take 25mg  metoprolol tartrate two hours prior to her cardiac CT scan.

## 2023-06-29 ENCOUNTER — Ambulatory Visit
Admission: RE | Admit: 2023-06-29 | Discharge: 2023-06-29 | Disposition: A | Discharge status: Home / Self Care | Source: Ambulatory Visit | Attending: Internal Medicine | Admitting: Internal Medicine

## 2023-06-29 DIAGNOSIS — R072 Precordial pain: Secondary | ICD-10-CM | POA: Diagnosis present

## 2023-06-29 MED ORDER — SODIUM CHLORIDE 0.9 % IV SOLN: INTRAVENOUS | Status: DC

## 2023-06-29 MED ORDER — IOHEXOL 350 MG/ML SOLN
100.0000 mL | Freq: Once | INTRAVENOUS | Status: AC | PRN
  Administered 2023-06-29: 100 mL via INTRAVENOUS

## 2023-06-29 MED ORDER — METOPROLOL TARTRATE 5 MG/5ML IV SOLN: 10.0000 mg | Freq: Once | INTRAVENOUS | Status: DC | PRN

## 2023-06-29 MED ORDER — DILTIAZEM HCL 25 MG/5ML IV SOLN: 10.0000 mg | INTRAVENOUS | Status: DC | PRN

## 2023-06-29 MED ORDER — NITROGLYCERIN 0.4 MG SL SUBL
0.8000 mg | SUBLINGUAL_TABLET | Freq: Once | SUBLINGUAL | Status: AC
  Administered 2023-06-29: 0.8 mg via SUBLINGUAL

## 2023-06-29 NOTE — Progress Notes (Signed)
 Patient tolerated procedure well. Ambulate w/o difficulty. Denies light headedness or being dizzy. Sitting up drinking water provided. Encouraged to drink extra water today and reasoning explained. Verbalized understanding. All questions answered. ABC intact. No further needs. Discharge from procedure area w/o issues.

## 2023-11-13 ENCOUNTER — Other Ambulatory Visit: Payer: Self-pay | Admitting: Internal Medicine

## 2023-11-17 ENCOUNTER — Other Ambulatory Visit: Payer: Self-pay | Admitting: Internal Medicine

## 2023-11-21 NOTE — Telephone Encounter (Signed)
 Requested Prescriptions  Pending Prescriptions Disp Refills   glipiZIDE  (GLUCOTROL ) 5 MG tablet [Pharmacy Med Name: glipiZIDE  5 MG Oral Tablet] 180 tablet 0    Sig: TAKE 1 TABLET BY MOUTH TWICE DAILY BEFORE A MEAL     Endocrinology:  Diabetes - Sulfonylureas Passed - 11/21/2023  8:22 AM      Passed - HBA1C is between 0 and 7.9 and within 180 days    Hgb A1c MFr Bld  Date Value Ref Range Status  06/06/2023 6.4 (H) <5.7 % of total Hgb Final    Comment:    For someone without known diabetes, a hemoglobin  A1c value between 5.7% and 6.4% is consistent with prediabetes and should be confirmed with a  follow-up test. . For someone with known diabetes, a value <7% indicates that their diabetes is well controlled. A1c targets should be individualized based on duration of diabetes, age, comorbid conditions, and other considerations. . This assay result is consistent with an increased risk of diabetes. . Currently, no consensus exists regarding use of hemoglobin A1c for diagnosis of diabetes for children. .          Passed - Cr in normal range and within 360 days    Creat  Date Value Ref Range Status  06/06/2023 0.72 0.50 - 1.05 mg/dL Final   Creatinine, Urine  Date Value Ref Range Status  06/06/2023 99 20 - 275 mg/dL Final         Passed - Valid encounter within last 6 months    Recent Outpatient Visits           5 months ago Type 2 diabetes mellitus with hyperglycemia, without long-term current use of insulin [facility])   [facility] [place], Angeline ORN, [place]

## 2023-11-30 ENCOUNTER — Encounter: Payer: Self-pay | Admitting: Internal Medicine

## 2023-11-30 ENCOUNTER — Ambulatory Visit (INDEPENDENT_AMBULATORY_CARE_PROVIDER_SITE_OTHER): Admitting: Internal Medicine

## 2023-11-30 VITALS — BP 118/64 | Ht 60.0 in | Wt 161.6 lb

## 2023-11-30 DIAGNOSIS — E66811 Obesity, class 1: Secondary | ICD-10-CM | POA: Diagnosis not present

## 2023-11-30 DIAGNOSIS — E1165 Type 2 diabetes mellitus with hyperglycemia: Secondary | ICD-10-CM

## 2023-11-30 DIAGNOSIS — Z7984 Long term (current) use of oral hypoglycemic drugs: Secondary | ICD-10-CM | POA: Diagnosis not present

## 2023-11-30 DIAGNOSIS — Z0001 Encounter for general adult medical examination with abnormal findings: Secondary | ICD-10-CM | POA: Diagnosis not present

## 2023-11-30 DIAGNOSIS — L508 Other urticaria: Secondary | ICD-10-CM

## 2023-11-30 DIAGNOSIS — M25511 Pain in right shoulder: Secondary | ICD-10-CM

## 2023-11-30 DIAGNOSIS — G8929 Other chronic pain: Secondary | ICD-10-CM | POA: Diagnosis not present

## 2023-11-30 DIAGNOSIS — Z6831 Body mass index (BMI) 31.0-31.9, adult: Secondary | ICD-10-CM

## 2023-11-30 DIAGNOSIS — E6609 Other obesity due to excess calories: Secondary | ICD-10-CM | POA: Diagnosis not present

## 2023-11-30 MED ORDER — LOVASTATIN 10 MG PO TABS: 10.0000 mg | ORAL_TABLET | Freq: Every day | ORAL | 1 refills | Status: DC

## 2023-11-30 MED ORDER — METFORMIN HCL ER 750 MG PO TB24: 750.0000 mg | ORAL_TABLET | Freq: Two times a day (BID) | ORAL | 1 refills | Status: DC

## 2023-11-30 MED ORDER — LISINOPRIL-HYDROCHLOROTHIAZIDE 10-12.5 MG PO TABS: 1.0000 | ORAL_TABLET | Freq: Every day | ORAL | 1 refills | Status: AC

## 2023-11-30 NOTE — Progress Notes (Signed)
 Subjective:    Patient ID: Courtney Mccarty, female    DOB: 01-Aug-1960, 63 y.o.   MRN: [id number]  HPI  Patient presents to clinic today for her annual exam. She also reports rash to her upper and lower extremities. She reports she gets this recurrently during the summer. She has not used and creams or antihistamines because she reports they make me drowsy and don't work. She has not seen an allergist in the past.  She also reports right shoulder pain. She describes the pain as tight and stiff. She has trouble lifting her right arm above her head. She reports weakness of the right arm but denies numbness or tingling. She denies any injury to the area. She has not taken any medications for this.  Flu: Never Tetanus: 11/2016 COVID: Never Prevnar 20: Never Shingrix: Never Pap smear: 06/2021 Mammogram: 09/2021 Bone density: 07/2021 Colon screening: 01/2023 Vision screening: annually Dentist: as needed  Diet: She does eat meat. She consumes fruits and veggies. She does eat fried foods. She drinks mostly water , juice Exercise: None  Review of Systems     Past Medical History:  Diagnosis Date   Essential hypertension 10/11/2016   Hyperlipidemia    Type 2 diabetes mellitus (HCC)     Current Outpatient Medications  Medication Sig Dispense Refill   EPINEPHrine 0.3 mg/0.3 mL IJ SOAJ injection Inject 0.3 mg into the muscle as needed.     glipiZIDE  (GLUCOTROL ) 5 MG tablet TAKE 1 TABLET BY MOUTH TWICE DAILY BEFORE A MEAL 180 tablet 0   lisinopril -hydrochlorothiazide  (ZESTORETIC ) 10-12.5 MG tablet Take 1 tablet by mouth daily. 90 tablet 1   lovastatin  (MEVACOR ) 10 MG tablet Take 1 tablet (10 mg total) by mouth at bedtime. 90 tablet 1   metFORMIN  (GLUCOPHAGE -XR) 750 MG 24 hr tablet Take 1 tablet (750 mg total) by mouth 2 (two) times daily. 180 tablet 1   metoprolol  tartrate (LOPRESSOR ) 25 MG tablet TAKE 1 TABLET 2 HR PRIOR TO CARDIAC PROCEDURE 1 tablet 0   Multiple Vitamin (MULTIVITAMIN)  tablet Take 1 tablet by mouth daily.     No current facility-administered medications for this visit.    No Known Allergies  Family History  Problem Relation Age of Onset   Diabetes Mother    Hypertension Mother    Depression Mother    Heart attack Mother 45   Diabetes Father    Hypertension Father    Diabetes Maternal Grandmother    Cancer Maternal Grandfather    Diabetes Paternal Grandmother    Cancer Paternal Grandfather     Social History   Socioeconomic History   Marital status: Single    Spouse name: Not on file   Number of children: Not on file   Years of education: Not on file   Highest education level: Not on file  Occupational History   Not on file  Tobacco Use   Smoking status: Never   Smokeless tobacco: Never  Vaping Use   Vaping status: Never Used  Substance and Sexual Activity   Alcohol use: Yes    Alcohol/week: 12.0 standard drinks of alcohol    Types: 12 Cans of beer per week    Comment: weekly   Drug use: No   Sexual activity: Not Currently    Birth control/protection: None  Other Topics Concern   Not on file  Social History Narrative   Not on file   Social Drivers of Health   Financial Resource Strain: Not on file  Food Insecurity:  Not on file  Transportation Needs: Not on file  Physical Activity: Not on file  Stress: Not on file  Social Connections: Not on file  Intimate Partner Violence: Not on file     Constitutional: Denies fever, malaise, fatigue, headache or abrupt weight changes.  HEENT: Denies eye pain, eye redness, ear pain, ringing in the ears, wax buildup, runny nose, nasal congestion, bloody nose, or sore throat. Respiratory: Denies difficulty breathing, shortness of breath, cough or sputum production.   Cardiovascular: Denies chest pain, chest tightness, palpitations or swelling in the hands or feet.  Gastrointestinal: Denies abdominal pain, bloating, constipation, diarrhea or blood in the stool.  GU: Denies urgency,  frequency, pain with urination, burning sensation, blood in urine, odor or discharge. Musculoskeletal: Pt reports right shoulder pain. Denies decrease in range of motion, difficulty with gait, muscle pain or joint swelling.  Skin: Pt reports hives. Denies redness, rashes, lesions or ulcercations.  Neurological: Denies dizziness, difficulty with memory, difficulty with speech or problems with balance and coordination.  Psych: Denies anxiety, depression, SI/HI.  No other specific complaints in a complete review of systems (except as listed in HPI above).  Objective:   Physical Exam  BP 118/64 (BP Location: Left Arm, Patient Position: Sitting, Cuff Size: Normal)   Ht 5' (1.524 m)   Wt 161 lb 9.6 oz (73.3 kg)   LMP 04/05/2015 (Approximate)   BMI 31.56 kg/m    Wt Readings from Last 3 Encounters:  06/07/23 163 lb 12.8 oz (74.3 kg)  06/06/23 161 lb (73 kg)  01/09/23 159 lb 9.6 oz (72.4 kg)    General: Appears her stated age, obese, in NAD. Skin: Warm, dry and intact. Hives noted of upper and lower extremities. No  ulcerations noted. HEENT: Head: normal shape and size; Eyes: sclera white, no icterus, conjunctiva pink, PERRLA and EOMs intact;  Neck:  Neck supple, trachea midline. No masses, lumps or thyromegaly present.  Cardiovascular: Normal rate and rhythm. S1,S2 noted.  No murmur, rubs or gallops noted. No JVD or BLE edema. No carotid bruits noted. Pulmonary/Chest: Normal effort and positive vesicular breath sounds. No respiratory distress. No wheezes, rales or ronchi noted.  Abdomen:  Normal bowel sounds.  Musculoskeletal: Decreased external rotation of the right shoulder. Normal internal rotation of the right shoulder. No pain with palpation of the shoulder. Positive drop can test on the right. Strength 5/5 BUE/BLE.  No difficulty with gait.  Neurological: Alert and oriented. Cranial nerves II-XII grossly intact. Coordination normal.  Psychiatric: Mood and affect flat. Behavior is  normal. Judgment and thought content normal.    BMET    Component Value Date/Time   NA 141 06/06/2023 0836   NA 140 12/07/2018 1009   K 3.9 06/06/2023 0836   CL 103 06/06/2023 0836   CO2 26 06/06/2023 0836   GLUCOSE 121 (H) 06/06/2023 0836   BUN 15 06/06/2023 0836   BUN 12 12/07/2018 1009   CREATININE 0.72 06/06/2023 0836   CALCIUM 9.8 06/06/2023 0836   GFRNONAA 84 04/22/2020 0832   GFRAA 97 04/22/2020 0832    Lipid Panel     Component Value Date/Time   CHOL 106 06/06/2023 0836   TRIG 138 06/06/2023 0836   HDL 33 (L) 06/06/2023 0836   CHOLHDL 3.2 06/06/2023 0836   VLDL 58 (H) 11/08/2016 0947   LDLCALC 51 06/06/2023 0836    CBC    Component Value Date/Time   WBC 8.3 06/06/2023 0836   RBC 4.40 06/06/2023 0836   HGB  12.5 06/06/2023 0836   HCT 38.3 06/06/2023 0836   PLT 247 06/06/2023 0836   MCV 87.0 06/06/2023 0836   MCH 28.4 06/06/2023 0836   MCHC 32.6 06/06/2023 0836   RDW 12.1 06/06/2023 0836   LYMPHSABS 2,714 05/23/2022 0842   MONOABS 360 11/08/2016 0947   EOSABS 262 05/23/2022 0842   BASOSABS 41 05/23/2022 0842    Hgb A1C Lab Results  Component Value Date   HGBA1C 6.4 (H) 06/06/2023           Assessment & Plan:   Preventative health maintenance:  Encouraged her to get a flu shot in fall Tetanus UTD Encouraged her to get her COVID-vaccine Prevnar 20 declined Discussed Shingrix vaccine, she will check coverage with her insurance company and schedule visit if she would like to have this done Pap smear UTD She declines further mammograms at this time Bone density UTD Colon screening UTD Encouraged her to consume a balanced diet and exercise regimen Advised her to see an eye doctor and dentist annually We will check CBC, c-Met, lipid, A1c today  Recurrent urticaria:  I do not want to give her a steroid injection today as this will raise her sugars Recommend she take Claritin 10 mg daily Recommend Benadryl 25-50 mg Q8H for breakthrough She  declines referral for allergy testing at this time  Right shoulder pain:  Given positive drop can test, concern for labral or rotater cuff issue She declines xray right shoulder today Encouraged shoulder exercises Can try Tylenol 500 mg every 8 hours as needed Will monitor for now  RTC in 6 months, follow-up chronic conditions Angeline Laura, NP

## 2023-11-30 NOTE — Patient Instructions (Signed)
 Health Maintenance for Postmenopausal Women Menopause is a normal process in which your ability to get pregnant comes to an end. This process happens slowly over many months or years, usually between the ages of 76 and 38. Menopause is complete when you have missed your menstrual period for 12 months. It is important to talk with your health care provider about some of the most common conditions that affect women after menopause (postmenopausal women). These include heart disease, cancer, and bone loss (osteoporosis). Adopting a healthy lifestyle and getting preventive care can help to promote your health and wellness. The actions you take can also lower your chances of developing some of these common conditions. What are the signs and symptoms of menopause? During menopause, you may have the following symptoms: Hot flashes. These can be moderate or severe. Night sweats. Decrease in sex drive. Mood swings. Headaches. Tiredness (fatigue). Irritability. Memory problems. Problems falling asleep or staying asleep. Talk with your health care provider about treatment options for your symptoms. Do I need hormone replacement therapy? Hormone replacement therapy is effective in treating symptoms that are caused by menopause, such as hot flashes and night sweats. Hormone replacement carries certain risks, especially as you become older. If you are thinking about using estrogen or estrogen with progestin, discuss the benefits and risks with your health care provider. How can I reduce my risk for heart disease and stroke? The risk of heart disease, heart attack, and stroke increases as you age. One of the causes may be a change in the body's hormones during menopause. This can affect how your body uses dietary fats, triglycerides, and cholesterol. Heart attack and stroke are medical emergencies. There are many things that you can do to help prevent heart disease and stroke. Watch your blood pressure High  blood pressure causes heart disease and increases the risk of stroke. This is more likely to develop in people who have high blood pressure readings or are overweight. Have your blood pressure checked: Every 3-5 years if you are 32-23 years of age. Every year if you are 31 years old or older. Eat a healthy diet  Eat a diet that includes plenty of vegetables, fruits, low-fat dairy products, and lean protein. Do not eat a lot of foods that are high in solid fats, added sugars, or sodium. Get regular exercise Get regular exercise. This is one of the most important things you can do for your health. Most adults should: Try to exercise for at least 150 minutes each week. The exercise should increase your heart rate and make you sweat (moderate-intensity exercise). Try to do strengthening exercises at least twice each week. Do these in addition to the moderate-intensity exercise. Spend less time sitting. Even light physical activity can be beneficial. Other tips Work with your health care provider to achieve or maintain a healthy weight. Do not use any products that contain nicotine or tobacco. These products include cigarettes, chewing tobacco, and vaping devices, such as e-cigarettes. If you need help quitting, ask your health care provider. Know your numbers. Ask your health care provider to check your cholesterol and your blood sugar (glucose). Continue to have your blood tested as directed by your health care provider. Do I need screening for cancer? Depending on your health history and family history, you may need to have cancer screenings at different stages of your life. This may include screening for: Breast cancer. Cervical cancer. Lung cancer. Colorectal cancer. What is my risk for osteoporosis? After menopause, you may be  at increased risk for osteoporosis. Osteoporosis is a condition in which bone destruction happens more quickly than new bone creation. To help prevent osteoporosis or  the bone fractures that can happen because of osteoporosis, you may take the following actions: If you are 24-54 years old, get at least 1,000 mg of calcium and at least 600 international units (IU) of vitamin D  per day. If you are older than age 75 but younger than age 30, get at least 1,200 mg of calcium and at least 600 international units (IU) of vitamin D  per day. If you are older than age 8, get at least 1,200 mg of calcium and at least 800 international units (IU) of vitamin D  per day. Smoking and drinking excessive alcohol increase the risk of osteoporosis. Eat foods that are rich in calcium and vitamin D , and do weight-bearing exercises several times each week as directed by your health care provider. How does menopause affect my mental health? Depression may occur at any age, but it is more common as you become older. Common symptoms of depression include: Feeling depressed. Changes in sleep patterns. Changes in appetite or eating patterns. Feeling an overall lack of motivation or enjoyment of activities that you previously enjoyed. Frequent crying spells. Talk with your health care provider if you think that you are experiencing any of these symptoms. General instructions See your health care provider for regular wellness exams and vaccines. This may include: Scheduling regular health, dental, and eye exams. Getting and maintaining your vaccines. These include: Influenza vaccine. Get this vaccine each year before the flu season begins. Pneumonia vaccine. Shingles vaccine. Tetanus, diphtheria, and pertussis (Tdap) booster vaccine. Your health care provider may also recommend other immunizations. Tell your health care provider if you have ever been abused or do not feel safe at home. Summary Menopause is a normal process in which your ability to get pregnant comes to an end. This condition causes hot flashes, night sweats, decreased interest in sex, mood swings, headaches, or lack  of sleep. Treatment for this condition may include hormone replacement therapy. Take actions to keep yourself healthy, including exercising regularly, eating a healthy diet, watching your weight, and checking your blood pressure and blood sugar levels. Get screened for cancer and depression. Make sure that you are up to date with all your vaccines. This information is not intended to replace advice given to you by your health care provider. Make sure you discuss any questions you have with your health care provider. Document Revised: 08/10/2020 Document Reviewed: 08/10/2020 Elsevier Patient Education  2024 [employer].

## 2023-11-30 NOTE — Assessment & Plan Note (Signed)
 Encourage diet and exercise for weight loss

## 2023-12-01 ENCOUNTER — Ambulatory Visit: Payer: Self-pay | Admitting: Internal Medicine

## 2023-12-01 LAB — CBC
HCT: 43.2 % (ref 35.0–45.0)
Hemoglobin: 14.1 g/dL (ref 11.7–15.5)
MCH: 28.3 pg (ref 27.0–33.0)
MCHC: 32.6 g/dL (ref 32.0–36.0)
MCV: 86.6 fL (ref 80.0–100.0)
MPV: 10.8 fL (ref 7.5–12.5)
Platelets: 266 Thousand/uL (ref 140–400)
RBC: 4.99 Million/uL (ref 3.80–5.10)
RDW: 13.1 % (ref 11.0–15.0)
WBC: 7.8 Thousand/uL (ref 3.8–10.8)

## 2023-12-01 LAB — COMPREHENSIVE METABOLIC PANEL WITH GFR
AG Ratio: 1.4 (calc) (ref 1.0–2.5)
ALT: 27 U/L (ref 6–29)
AST: 28 U/L (ref 10–35)
Albumin: 4.4 g/dL (ref 3.6–5.1)
Alkaline phosphatase (APISO): 76 U/L (ref 37–153)
BUN: 18 mg/dL (ref 7–25)
CO2: 29 mmol/L (ref 20–32)
Calcium: 10.1 mg/dL (ref 8.6–10.4)
Chloride: 102 mmol/L (ref 98–110)
Creat: 0.75 mg/dL (ref 0.50–1.05)
Globulin: 3.2 g/dL (ref 1.9–3.7)
Glucose, Bld: 161 mg/dL — ABNORMAL HIGH (ref 65–99)
Potassium: 4.5 mmol/L (ref 3.5–5.3)
Sodium: 138 mmol/L (ref 135–146)
Total Bilirubin: 0.6 mg/dL (ref 0.2–1.2)
Total Protein: 7.6 g/dL (ref 6.1–8.1)
eGFR: 90 mL/min/1.73m2 (ref >=60)

## 2023-12-01 LAB — LIPID PANEL
Cholesterol: 129 mg/dL (ref <200)
HDL: 34 mg/dL — ABNORMAL LOW (ref >=50)
LDL Cholesterol (Calc): 60 mg/dL
Non-HDL Cholesterol (Calc): 95 mg/dL (ref <130)
Total CHOL/HDL Ratio: 3.8 (calc) (ref <5.0)
Triglycerides: 334 mg/dL — ABNORMAL HIGH (ref <150)

## 2023-12-01 LAB — HEMOGLOBIN A1C
Hgb A1c MFr Bld: 7.1 % — ABNORMAL HIGH (ref <5.7)
Mean Plasma Glucose: 157 mg/dL
eAG (mmol/L): 8.7 mmol/L

## 2023-12-05 ENCOUNTER — Ambulatory Visit: Payer: Self-pay

## 2023-12-05 NOTE — Telephone Encounter (Signed)
  third attempt: LVM for patient to return call to [phone]. Call routed to [facility] for follow up   Copied from CRM #[id number]. Topic: Clinical - Medication Question >> Dec 05, 2023  4:07 PM Courtney Mccarty wrote: Reason for CRM: Patient was questioning what triglycerides were. Call back #[phone]

## 2023-12-05 NOTE — Telephone Encounter (Signed)
 First attempt: LVM for patient to return call to [phone]  Copied from CRM #[id number]. Topic: Clinical - Medication Question >> Dec 05, 2023  4:07 PM Kevelyn M wrote: Reason for CRM: Patient was questioning what triglycerides were. Call back #[phone]

## 2023-12-05 NOTE — Telephone Encounter (Signed)
 Second attempt: LVM for patient to return call to [phone]   Copied from CRM #[id number]. Topic: Clinical - Medication Question >> Dec 05, 2023  4:07 PM Kevelyn M wrote: Reason for CRM: Patient was questioning what triglycerides were. Call back #[phone]

## 2023-12-06 ENCOUNTER — Other Ambulatory Visit: Payer: Self-pay | Admitting: Internal Medicine

## 2023-12-06 MED ORDER — LOVASTATIN 20 MG PO TABS: 20.0000 mg | ORAL_TABLET | Freq: Every day | ORAL | 1 refills | Status: AC

## 2023-12-06 NOTE — Addendum Note (Signed)
 Addended by: ANTONETTE ANGELINE ORN on: 12/06/2023 08:59 AM   Modules accepted: Orders

## 2023-12-06 NOTE — Telephone Encounter (Signed)
Lovastatin sent to pharmacy.

## 2024-01-30 ENCOUNTER — Other Ambulatory Visit: Payer: Self-pay | Admitting: Internal Medicine

## 2024-01-30 NOTE — Telephone Encounter (Unsigned)
 Copied from CRM [id number]. Topic: Clinical - Medication Refill >> Jan 30, 2024 10:16 AM Berwyn MATSU wrote: Medication: metFORMIN  (GLUCOPHAGE -XR) 750 MG 24 hr tablet   Has the patient contacted their pharmacy? Yes (Agent: If no, request that the patient contact the pharmacy for the refill. If patient does not wish to contact the pharmacy document the reason why and proceed with request.) (Agent: If yes, when and what did the pharmacy advise?)  This is the patient's preferred pharmacy:  [facility] [address], [place] - [address] [place] [address] Phone: [phone] Fax: [phone]   Is this the correct pharmacy for this prescription? Yes If no, delete pharmacy and type the correct one.   Has the prescription been filled recently?yes   Is the patient out of the medication? Yes  Has the patient been seen for an appointment in the last year OR does the patient have an upcoming appointment? Yes  Can we respond through MyChart? No  Agent: Please be advised that Rx refills may take up to 3 business days. We ask that you follow-up with your pharmacy.

## 2024-02-01 NOTE — Telephone Encounter (Signed)
 Too soon for refill.  Requested Prescriptions  Pending Prescriptions Disp Refills   metFORMIN  (GLUCOPHAGE -XR) 750 MG 24 hr tablet 180 tablet 1    Sig: Take 1 tablet (750 mg total) by mouth 2 (two) times daily.     Endocrinology:  Diabetes - Biguanides Failed - 02/01/2024  9:22 AM      Failed - B12 Level in normal range and within 720 days    No results found for: VITAMINB12       Failed - CBC within normal limits and completed in the last 12 months    WBC  Date Value Ref Range Status  11/30/2023 7.8 3.8 - 10.8 Thousand/uL Final   RBC  Date Value Ref Range Status  11/30/2023 4.99 3.80 - 5.10 Million/uL Final   Hemoglobin  Date Value Ref Range Status  11/30/2023 14.1 11.7 - 15.5 g/dL Final   HCT  Date Value Ref Range Status  11/30/2023 43.2 35.0 - 45.0 % Final   MCHC  Date Value Ref Range Status  11/30/2023 32.6 32.0 - 36.0 g/dL Final    Comment:    For adults, a slight decrease in the calculated MCHC value (in the range of 30 to 32 g/dL) is most likely not clinically significant; however, it should be interpreted with caution in correlation with other red cell parameters and the patient's clinical condition.    [facility]  Date Value Ref Range Status  11/30/2023 28.3 27.0 - 33.0 pg Final   MCV  Date Value Ref Range Status  11/30/2023 86.6 80.0 - 100.0 fL Final   No results found for: PLTCOUNTKUC, LABPLAT, POCPLA RDW  Date Value Ref Range Status  11/30/2023 13.1 11.0 - 15.0 % Final         Passed - Cr in normal range and within 360 days    Creat  Date Value Ref Range Status  11/30/2023 0.75 0.50 - 1.05 mg/dL Final   Creatinine, Urine  Date Value Ref Range Status  06/06/2023 99 20 - 275 mg/dL Final         Passed - HBA1C is between 0 and 7.9 and within 180 days    Hgb A1c MFr Bld  Date Value Ref Range Status  11/30/2023 7.1 (H) <5.7 % Final    Comment:    For someone without known diabetes, a hemoglobin A1c value of 6.5% or greater indicates that  they may have  diabetes and this should be confirmed with a follow-up  test. . For someone with known diabetes, a value <7% indicates  that their diabetes is well controlled and a value  greater than or equal to 7% indicates suboptimal  control. A1c targets should be individualized based on  duration of diabetes, age, comorbid conditions, and  other considerations. . Currently, no consensus exists regarding use of hemoglobin A1c for diagnosis of diabetes for children. .          Passed - eGFR in normal range and within 360 days    GFR, Est African American  Date Value Ref Range Status  04/22/2020 97 > OR = 60 mL/min/1.24m2 Final   GFR, Est Non African American  Date Value Ref Range Status  04/22/2020 84 > OR = 60 mL/min/1.56m2 Final   eGFR  Date Value Ref Range Status  11/30/2023 90 > OR = 60 mL/min/1.83m2 Final         Passed - Valid encounter within last 6 months    Recent Outpatient Visits  2 months ago Encounter for general adult medical examination with abnormal findings   [place] [facility] [place], [place] W, NP   8 months ago Type 2 diabetes mellitus with hyperglycemia, without long-term current use of insulin [facility])   [place] [facility] [place], [place], [place]

## 2024-02-18 IMAGING — MG DIGITAL DIAGNOSTIC BILAT W/ TOMO W/ CAD
8 series · 8 of 16 positions shown · non-contrast
Comparison: Previous exam(s).

CLINICAL DATA: The patient was called back for bilateral breast
calcifications.

EXAM:
DIGITAL DIAGNOSTIC BILATERAL MAMMOGRAM WITH TOMOSYNTHESIS AND CAD
TECHNIQUE: Bilateral digital diagnostic mammography and breast tomosynthesis
was performed. The images were evaluated with computer-aided
detection.

[R ML]
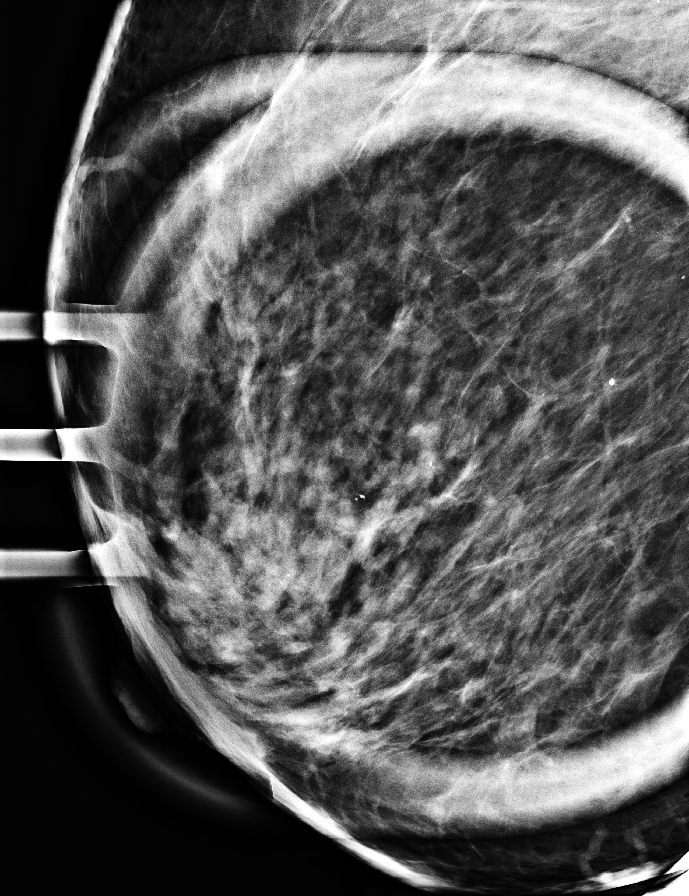

[L CC]
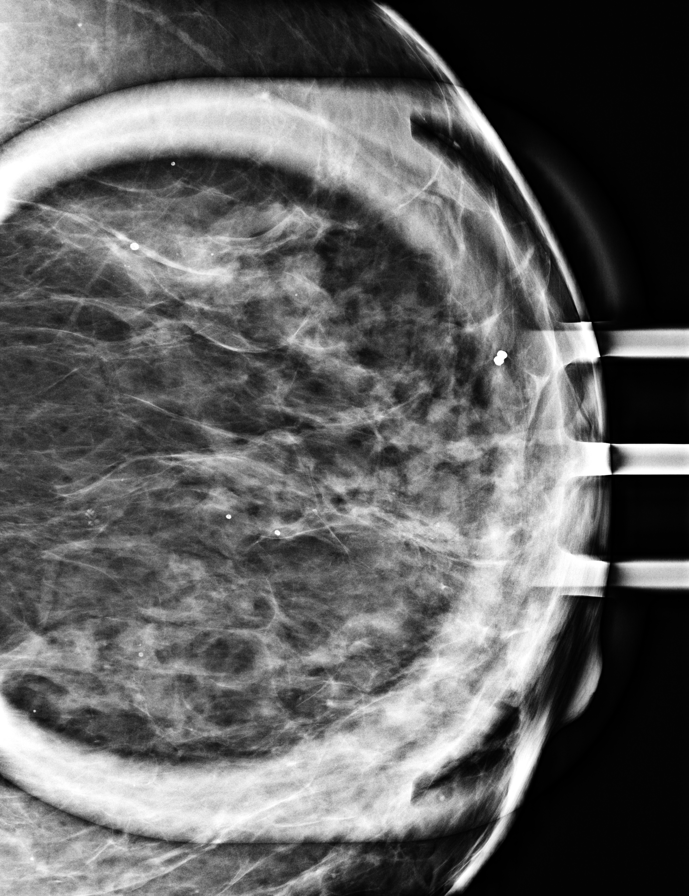

[R CC]
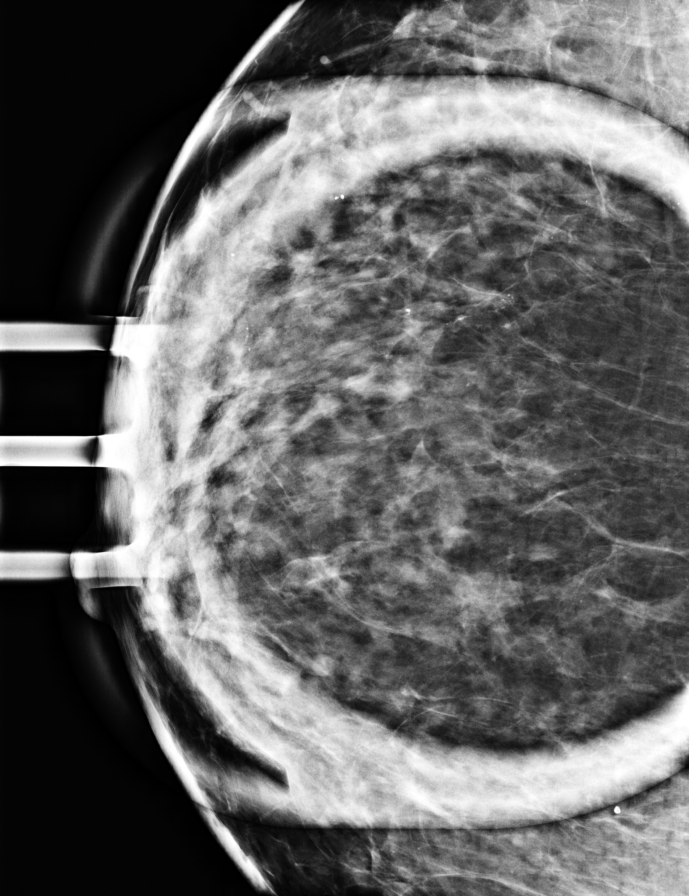

[L ML]
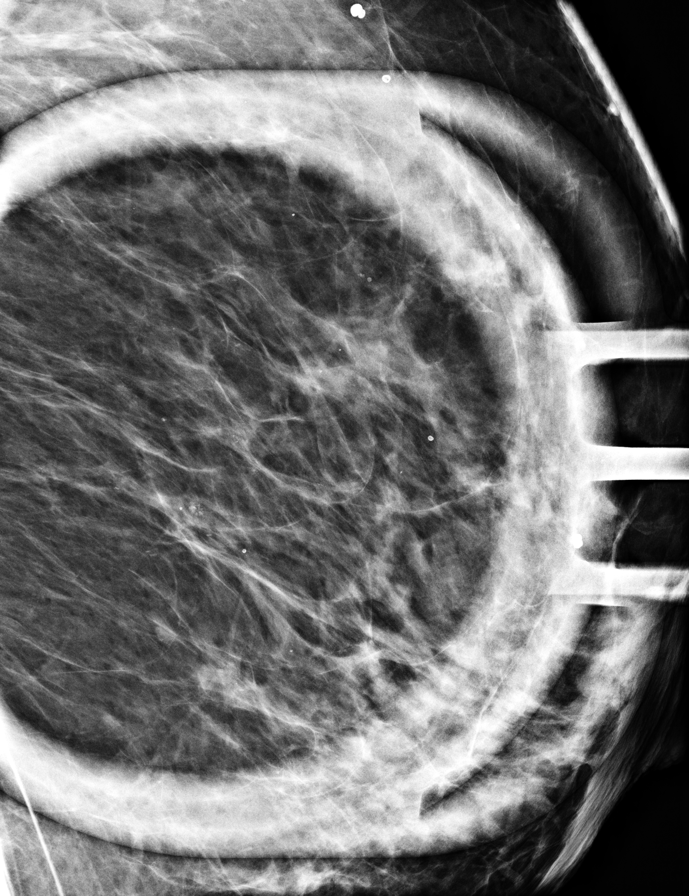

[L ML synth-2D]
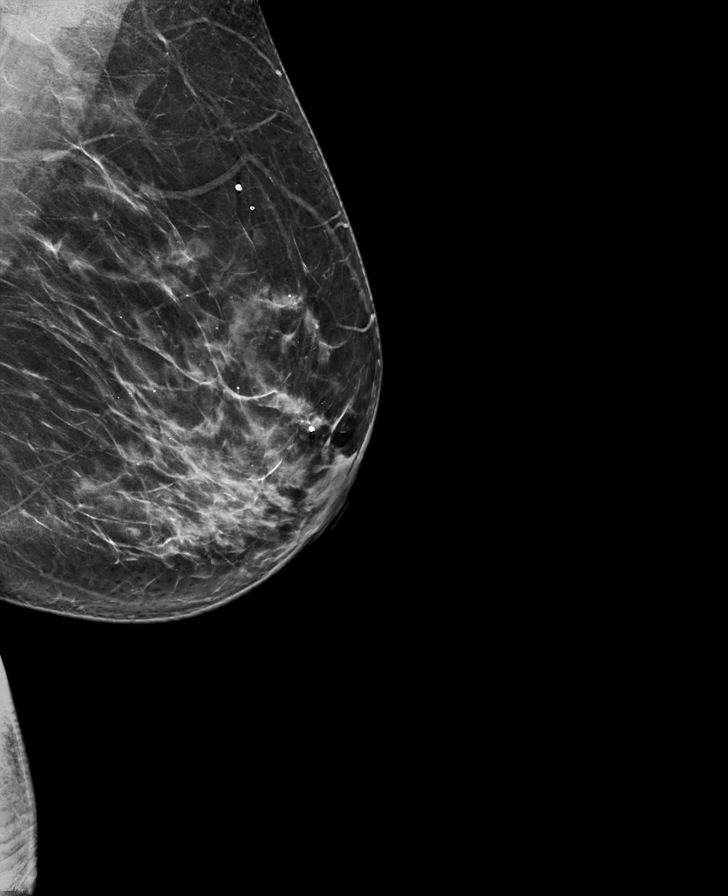

[R ML synth-2D]
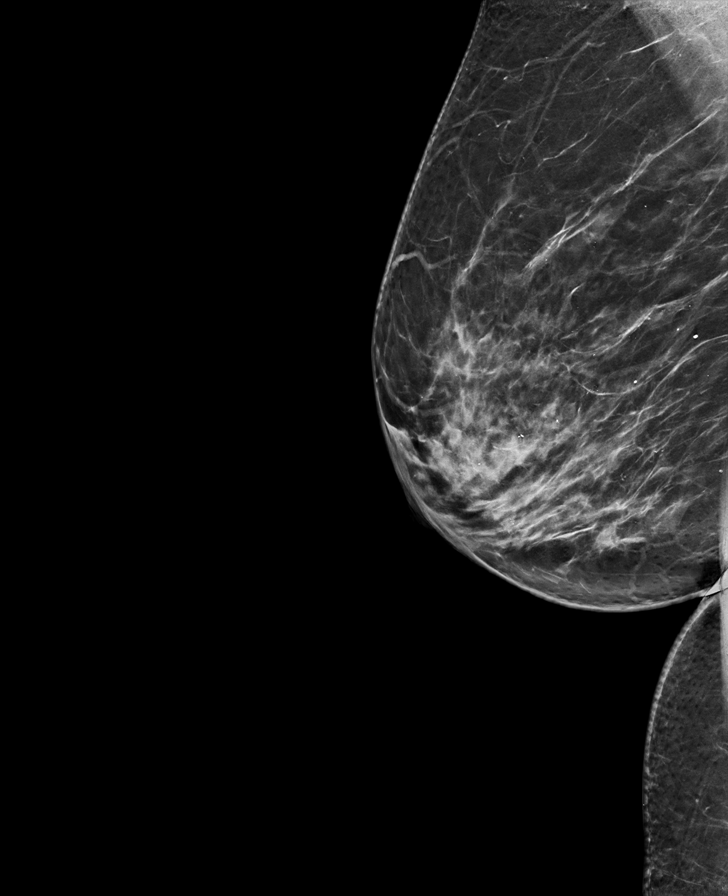

[L ML tomo · tomo slice 42/83.0]
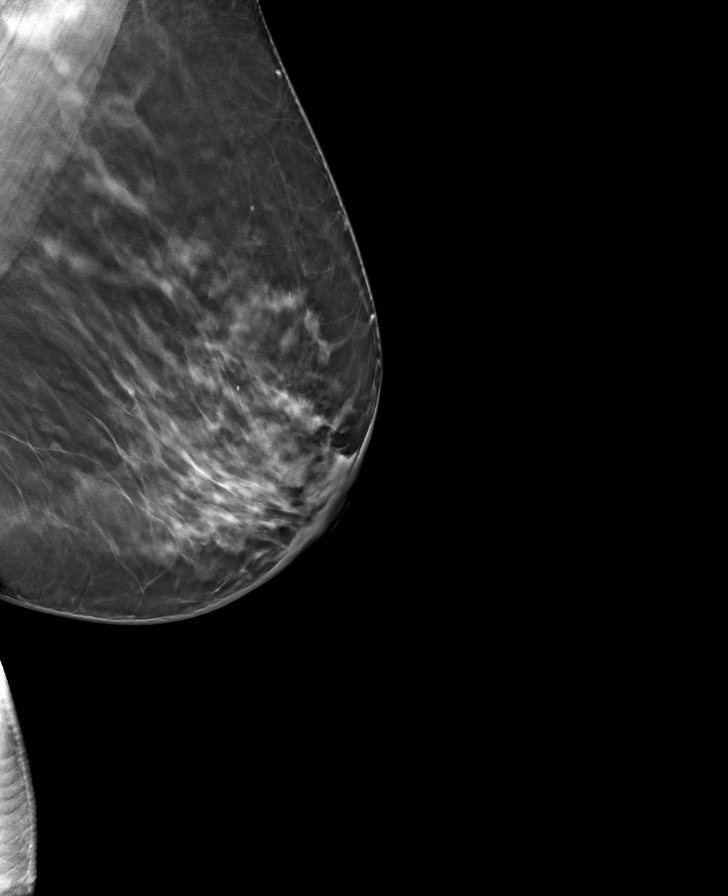

[R ML tomo · tomo slice 39/77.0]
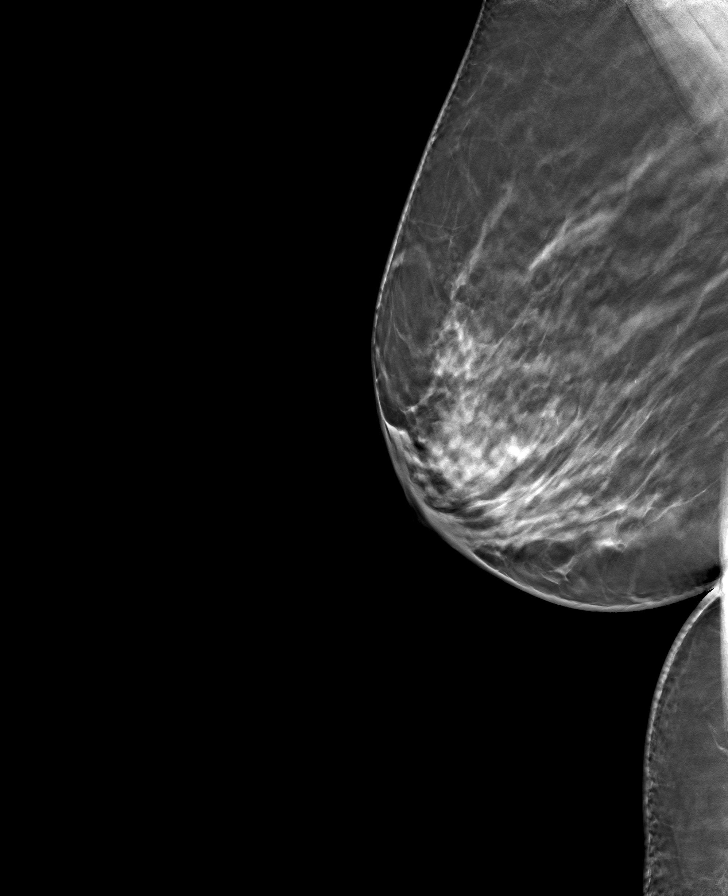

[8 of 16 positions shown; findings below may reference images not displayed]

ACR Breast Density Category c: The breast tissue is heterogeneously
dense, which may obscure small masses.
FINDINGS: There are calcifications in the lateral central right breast which
span 1.5 cm. These calcifications are arrayed in a linear pattern.
There are more numerous calcifications along the posterior aspect of
the finding.

There is an amorphous group of calcifications in the left breast
spanning 3 mm which are located laterally and centrally at a mid
depth.
IMPRESSION: Bilateral indeterminate breast calcifications as above.

RECOMMENDATION:
Recommend stereotactic biopsy of the bilateral calcifications. It is
possible the anterior extent of calcifications on the right could be
vascular in nature. However, the more numerous calcifications along
the posterior aspect of the group do not appear to be vascular.
Recommend stereotactic biopsy of the posterior aspect of
indeterminate right breast calcifications.

I have discussed the findings and recommendations with the patient.
If applicable, a reminder letter will be sent to the patient
regarding the next appointment.

BI-RADS CATEGORY  4: Suspicious.

## 2024-04-02 ENCOUNTER — Other Ambulatory Visit: Payer: Self-pay | Admitting: Internal Medicine

## 2024-04-02 NOTE — Telephone Encounter (Signed)
 Copied from CRM [id number]. Topic: Clinical - Medication Refill >> Apr 02, 2024  4:45 PM Sophia H wrote: Medication: metFORMIN  (GLUCOPHAGE -XR) 750 MG 24 hr tablet   Has the patient contacted their pharmacy? Yes, updated rx needed.    This is the patient's preferred pharmacy:  [facility] [address] [place] - [address] [place] [address] Phone: [phone] Fax: [phone]   Is this the correct pharmacy for this prescription? Yes If no, delete pharmacy and type the correct one.   Has the prescription been filled recently? Yes  Is the patient out of the medication? Yes  Has the patient been seen for an appointment in the last year OR does the patient have an upcoming appointment? Yes, seen in August has another appt in February.   Can we respond through MyChart? Yes  Agent: Please be advised that Rx refills may take up to 3 business days. We ask that you follow-up with your pharmacy.

## 2024-04-04 MED ORDER — METFORMIN HCL ER 750 MG PO TB24: 750.0000 mg | ORAL_TABLET | Freq: Two times a day (BID) | ORAL | 0 refills | Status: AC

## 2024-04-04 NOTE — Telephone Encounter (Signed)
 Requested Prescriptions  Pending Prescriptions Disp Refills   metFORMIN  (GLUCOPHAGE -XR) 750 MG 24 hr tablet 180 tablet 0    Sig: Take 1 tablet (750 mg total) by mouth 2 (two) times daily.     Endocrinology:  Diabetes - Biguanides Failed - 04/04/2024 10:25 AM      Failed - B12 Level in normal range and within 720 days    No results found for: VITAMINB12       Failed - CBC within normal limits and completed in the last 12 months    WBC  Date Value Ref Range Status  11/30/2023 7.8 3.8 - 10.8 Thousand/uL Final   RBC  Date Value Ref Range Status  11/30/2023 4.99 3.80 - 5.10 Million/uL Final   Hemoglobin  Date Value Ref Range Status  11/30/2023 14.1 11.7 - 15.5 g/dL Final   HCT  Date Value Ref Range Status  11/30/2023 43.2 35.0 - 45.0 % Final   MCHC  Date Value Ref Range Status  11/30/2023 32.6 32.0 - 36.0 g/dL Final    Comment:    For adults, a slight decrease in the calculated MCHC value (in the range of 30 to 32 g/dL) is most likely not clinically significant; however, it should be interpreted with caution in correlation with other red cell parameters and the patient's clinical condition.    [facility]  Date Value Ref Range Status  11/30/2023 28.3 27.0 - 33.0 pg Final   MCV  Date Value Ref Range Status  11/30/2023 86.6 80.0 - 100.0 fL Final   No results found for: PLTCOUNTKUC, LABPLAT, POCPLA RDW  Date Value Ref Range Status  11/30/2023 13.1 11.0 - 15.0 % Final         Passed - Cr in normal range and within 360 days    Creat  Date Value Ref Range Status  11/30/2023 0.75 0.50 - 1.05 mg/dL Final   Creatinine, Urine  Date Value Ref Range Status  06/06/2023 99 20 - 275 mg/dL Final         Passed - HBA1C is between 0 and 7.9 and within 180 days    Hgb A1c MFr Bld  Date Value Ref Range Status  11/30/2023 7.1 (H) <5.7 % Final    Comment:    For someone without known diabetes, a hemoglobin A1c value of 6.5% or greater indicates that they may have  diabetes  and this should be confirmed with a follow-up  test. . For someone with known diabetes, a value <7% indicates  that their diabetes is well controlled and a value  greater than or equal to 7% indicates suboptimal  control. A1c targets should be individualized based on  duration of diabetes, age, comorbid conditions, and  other considerations. . Currently, no consensus exists regarding use of hemoglobin A1c for diagnosis of diabetes for children. .          Passed - eGFR in normal range and within 360 days    GFR, Est African American  Date Value Ref Range Status  04/22/2020 97 > OR = 60 mL/min/1.32m2 Final   GFR, Est Non African American  Date Value Ref Range Status  04/22/2020 84 > OR = 60 mL/min/1.72m2 Final   eGFR  Date Value Ref Range Status  11/30/2023 90 > OR = 60 mL/min/1.94m2 Final         Passed - Valid encounter within last 6 months    Recent Outpatient Visits           4 months  ago Encounter for general adult medical examination with abnormal findings   [facility] [place], [place], NP   10 months ago Type 2 diabetes mellitus with hyperglycemia, without long-term current use of insulin [facility])   [facility] [place], [place], [place]

## 2024-05-28 ENCOUNTER — Ambulatory Visit: Admitting: Internal Medicine
